# Patient Record
Sex: Female | Born: 1990
Health system: Southern US, Community
[De-identification: ages and names within clinical notes are randomized; demographics above are authoritative.]

## PROBLEM LIST (undated history)

## (undated) ENCOUNTER — Inpatient Hospital Stay (HOSPITAL_COMMUNITY): Payer: Self-pay

## (undated) DIAGNOSIS — Z789 Other specified health status: Secondary | ICD-10-CM

## (undated) DIAGNOSIS — O24419 Gestational diabetes mellitus in pregnancy, unspecified control: Secondary | ICD-10-CM

## (undated) HISTORY — DX: Gestational diabetes mellitus in pregnancy, unspecified control: O24.419

## (undated) HISTORY — PX: BACK SURGERY: SHX140

---
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
## 2009-03-22 ENCOUNTER — Encounter: Payer: Self-pay | Admitting: Family Medicine

## 2009-03-22 ENCOUNTER — Ambulatory Visit: Payer: Self-pay | Admitting: Family Medicine

## 2009-03-22 LAB — CONVERTED CEMR LAB
Beta hcg, urine, semiquantitative: NEGATIVE
Bilirubin Urine: NEGATIVE
Chlamydia, DNA Probe: NEGATIVE
Glucose, Urine, Semiquant: NEGATIVE
pH: 7

## 2009-03-23 ENCOUNTER — Encounter: Payer: Self-pay | Admitting: Family Medicine

## 2009-03-26 ENCOUNTER — Encounter (INDEPENDENT_AMBULATORY_CARE_PROVIDER_SITE_OTHER): Payer: Self-pay | Admitting: *Deleted

## 2009-06-23 ENCOUNTER — Emergency Department (HOSPITAL_COMMUNITY): Admission: EM | Admit: 2009-06-23 | Discharge: 2009-06-23 | Payer: Self-pay | Admitting: Emergency Medicine

## 2009-06-30 ENCOUNTER — Ambulatory Visit: Payer: Self-pay | Admitting: Family Medicine

## 2009-06-30 LAB — CONVERTED CEMR LAB
Blood in Urine, dipstick: NEGATIVE
Epithelial cells, urine: >20 /lpf
Nitrite: NEGATIVE
Specific Gravity, Urine: 1.015
Urobilinogen, UA: 1
WBC, UA: >20 cells/hpf
pH: 6.5

## 2009-07-25 ENCOUNTER — Emergency Department (HOSPITAL_COMMUNITY): Admission: EM | Admit: 2009-07-25 | Discharge: 2009-07-25 | Payer: Self-pay | Admitting: Emergency Medicine

## 2009-07-25 ENCOUNTER — Telehealth: Payer: Self-pay | Admitting: *Deleted

## 2009-07-26 ENCOUNTER — Other Ambulatory Visit: Admission: RE | Admit: 2009-07-26 | Discharge: 2009-07-26 | Payer: Self-pay | Admitting: Interventional Radiology

## 2009-07-26 ENCOUNTER — Encounter: Admission: RE | Admit: 2009-07-26 | Discharge: 2009-07-26 | Payer: Self-pay | Admitting: Otolaryngology

## 2009-07-28 ENCOUNTER — Telehealth: Payer: Self-pay | Admitting: Family Medicine

## 2009-08-02 ENCOUNTER — Ambulatory Visit (HOSPITAL_COMMUNITY): Admission: RE | Admit: 2009-08-02 | Discharge: 2009-08-02 | Payer: Self-pay | Admitting: Otolaryngology

## 2009-08-05 ENCOUNTER — Ambulatory Visit: Payer: Self-pay | Admitting: Family Medicine

## 2009-11-10 ENCOUNTER — Ambulatory Visit: Payer: Self-pay | Admitting: Family Medicine

## 2010-05-18 ENCOUNTER — Encounter: Payer: Self-pay | Admitting: Family Medicine

## 2010-05-18 ENCOUNTER — Ambulatory Visit: Payer: Self-pay | Admitting: Family Medicine

## 2010-05-18 LAB — CONVERTED CEMR LAB
Chlamydia, DNA Probe: NEGATIVE
GC Probe Amp, Genital: NEGATIVE
Whiff Test: POSITIVE

## 2010-05-30 ENCOUNTER — Encounter: Payer: Self-pay | Admitting: Family Medicine

## 2010-06-16 ENCOUNTER — Ambulatory Visit: Admission: RE | Admit: 2010-06-16 | Discharge: 2010-06-16 | Payer: Self-pay | Source: Home / Self Care

## 2010-06-16 DIAGNOSIS — N938 Other specified abnormal uterine and vaginal bleeding: Secondary | ICD-10-CM | POA: Insufficient documentation

## 2010-06-16 DIAGNOSIS — N925 Other specified irregular menstruation: Secondary | ICD-10-CM | POA: Insufficient documentation

## 2010-06-16 DIAGNOSIS — N949 Unspecified condition associated with female genital organs and menstrual cycle: Secondary | ICD-10-CM

## 2010-06-16 DIAGNOSIS — N921 Excessive and frequent menstruation with irregular cycle: Secondary | ICD-10-CM | POA: Insufficient documentation

## 2010-07-09 ENCOUNTER — Encounter: Payer: Self-pay | Admitting: Otolaryngology

## 2010-07-20 NOTE — Letter (Signed)
Summary: Generic Letter  [facility]  [address]   [place], [place] [address]   Phone: [phone]  Fax: [phone]    05/30/2010  Ariel Cardenas [address] [place], [place]  [address]  Dear Ms. Sitter,  I am writing to inform you that all your laboratory studies were normal.  Please start your birth control pills if you have not already to prevent pregnancy.  It is also important to use condoms every time you have intercourse to prevent you from getting an infection.  Please contact the office if you have any problems or questions.   Sincerely,   Ardyth Gal MD  Appended Document: Generic Letter mailed

## 2010-07-20 NOTE — Assessment & Plan Note (Signed)
Summary: candidiasis vaginal   Vital Signs:  Patient profile:   20 year old female Weight:      123.6 pounds BMI:     23.25 Temp:     98.3 degrees F oral Pulse rate:   91 / minute Pulse rhythm:   regular BP sitting:   117 / 80  (left arm) Cuff size:   regular  Vitals Entered By: Loralee Pacas CMA (June 30, 2009 1:56 PM)  Primary Care Provider:  Ancil Boozer  MD  CC:  concerned for yeast infxn.  History of Present Illness: 20yo F here with concern for yeast infxn  Concern for yeast infxn: Symptoms started 2 days ago.  Vaginal itching, irritation, and discharge.  No dysuria, hematuria, pelvic/abd pain, or fever.  States that she has had yeast infections in the past that presented the same way.  States she was on abx last week for a throat infection.    Allergies: No Known Drug Allergies  Review of Systems        Vaginal itching, irritation, and discharge.  No dysuria, hematuria, pelvic/abd pain, or fever.  Physical Exam  General:  VS reviewed.  Well appearing, NAD Genitalia:  External genitalia nl w/o lesions Nl vaginal mucosa with thick white discharge- wet prep obtained; pt declined GC/Chl screening   Impression & Recommendations:  Problem # 1:  CANDIDIASIS, VAGINAL (ICD-112.1) Assessment Deteriorated  Wet prep positive for significant yeast.  Pt responded well to fluconazole in the past.  Will treat with 150mg  x 1.  Will f/u if no improvements.   The following medications were removed from the medication list:    Fluconazole 150 Mg Tabs (Fluconazole) .Marland Kitchen... 1 tab by mouth x1 now. Her updated medication list for this problem includes:    Fluconazole 150 Mg Tabs (Fluconazole) .Marland Kitchen... 1 tab by mouth once  Orders: [facility]- Est Level  3 ([address])  Complete Medication List: 1)  Fluconazole 150 Mg Tabs (Fluconazole) .Marland Kitchen.. 1 tab by mouth once  Other Orders: [facility] (00000) [id number] Prescriptions: FLUCONAZOLE 150 MG TABS (FLUCONAZOLE) 1 tab by mouth  once  #1 x 0   Entered and Authorized by:   Marisue Ivan  MD   Signed by:   Marisue Ivan  MD on 06/30/2009   Method used:   [facility]        [facility] [address] [phone]* (retail)       [address]       [place], [place]  [address]       Ph: [phone] or [phone]       Fax: [phone]   RxID:   [id number]   Laboratory Results   Urine Tests  Date/Time Received: June 30, 2009 2:06 PM Date/Time Reported: June 30, 2009 3:05 PM   Routine Urinalysis   Color: yellow Appearance: Clear Glucose: negative   (Normal Range: Negative) Bilirubin: smal;  reflex to ictotest = negative   (Normal Range: Negative) Ketone: small (15)   (Normal Range: Negative) Spec. Gravity: 1.015   (Normal Range: 1.003-1.035) Blood: negative   (Normal Range: Negative) pH: 6.5   (Normal Range: 5.0-8.0) Protein: 30   (Normal Range: Negative) Urobilinogen: 1.0   (Normal Range: 0-1) Nitrite: negative   (Normal Range: Negative) Leukocyte Esterace: moderate   (Normal Range: Negative)  Urine Microscopic WBC/HPF: >20 RBC/HPF: occ Bacteria/HPF: 3+ Mucous/HPF: 3+ Epithelial/HPF: >20 Yeast/HPF: moderate hyphae and spores    Comments: ...............test performed by......Marland KitchenKendal Hymen  A. [place], MLS (ASCP)cm  Date/Time Received: June 30, 2009 2:06 PM  Date/Time Reported: June 30, 2009 3:16 PM   [employer] Source: vag WBC/hpf: >20 Bacteria/hpf: 3+  Rods Clue cells/hpf: none  Negative whiff Yeast/hpf: many Trichomonas/hpf: none Comments: ...............test performed by......Marland KitchenBonnie A. [place], MLS (ASCP)cm

## 2010-07-20 NOTE — Assessment & Plan Note (Signed)
Summary: bleeding/severe cramps/eo   Vital Signs:  Patient profile:   20 year old female Height:      61.25 inches Weight:      121 pounds BMI:     22.76 Temp:     98.0 degrees F oral Pulse rate:   81 / minute BP sitting:   112 / 84  (left arm) Cuff size:   regular  Vitals Entered By: Garen Grams LPN (June 16, 2010 2:10 PM) CC: break through bleeding between periods Is Patient Diabetic? No Pain Assessment Patient in pain? no        Primary Provider:  . [facility]  CC:  break through bleeding between periods.  History of Present Illness: Pt here to discuss breakthorugh bleeding. She was recently checked for STDs.   ? breakthrough bleeding- LMP 12/18-12/22. Now bleeding x 2 days with 4-5 days of cramping. This is the first time this has occured. She has not yet picked up the birth control prescribed to her at her last visit. She denies dysuria, increased urinary frequecy and hesitancy. She is sexually active with one partner. Denies pain with intercourse. She does not use condoms.     Preventive Screening-Counseling & Management  Alcohol-Tobacco     Smoking Status: never     Packs/Day: occ  Allergies: No Known Drug Allergies  Physical Exam  General:  Well-developed,well-nourished,in no acute distress; alert,appropriate and cooperative throughout examination Abdomen:  Bowel sounds positive,abdomen soft and without masses, organomegaly or hernias noted. MIld TTP.    Impression & Recommendations:  Problem # 1:  MENSTRUAL SPOTTING (ICD-626.6) HCG negtive. Reassured pt that occasional menstrual spotting is normal. Encouraged pt to pick up OCPs and comdoms during intercourse to protect against STDs.   Orders: [facility]- Est Level  3 ([address]78)  Complete Medication List: 1)  Necon 10/11 (28) 35 Mcg Tabs (Norethin-eth estrad biphasic) .... One by mouth daily  Other Orders: U Preg-[facility] (29562)  Patient Instructions: 1)  It was nice to meet you today. 2)  Your  pregnancy test is negative. 3)  I would like to reassure that your bleeding is normal, most women will have one abnormal period/episode of bleeding in one year. 4)  Go ahead and pick-up your birth control pills and start them.  5)  Use condoms for birth control and STD prevention. 6)  -Dr. Armen Pickup   Orders Added: 1)  U Preg-[facility] [81025] 2)  [facility]- Est Level  3 [13086]    Laboratory Results   Urine Tests  Date/Time Received: June 16, 2010 2:41 PM  Date/Time Reported: June 16, 2010 2:52 PM     Urine HCG: negative Comments: ...............test performed by......Marland KitchenBonnie A. [place], MLS (ASCP)cm

## 2010-07-20 NOTE — Progress Notes (Signed)
Summary: triag  Phone Note Call from Patient Call back at [phone]   Caller: Patient Summary of Call: Pt has been on an antibotic and thinks she has yeast infection wondering if she can get something called in for it.  [facility]. Initial call taken by: Clydell Hakim,  July 28, 2009 12:22 PM  Follow-up for Phone Call        to Dr. Constance Goltz Follow-up by: Golden Circle RN,  July 28, 2009 12:29 PM  Additional Follow-up for Phone Call Additional follow up Details #1::        Fluconazole x1 day called in.  Please inform patient to make appointment if:  fever, abdominal pain, vaginal discharge, dysuria, yeast infection symptoms not better in 2 days. Additional Follow-up by: Romero Belling MD,  July 28, 2009 12:41 PM    Additional Follow-up for Phone Call Additional follow up Details #2::    left message. want to tell her rx is sent & give her flags of when to come in Follow-up by: Golden Circle RN,  July 28, 2009 1:43 PM  Additional Follow-up for Phone Call Additional follow up Details #3:: Details for Additional Follow-up Action Taken: pt returned call Additional Follow-up by: Clydell Hakim,  July 28, 2009 1:48 PM  New/Updated Medications: FLUCONAZOLE 150 MG TABS (FLUCONAZOLE) 1 tab by mouth once Prescriptions: FLUCONAZOLE 150 MG TABS (FLUCONAZOLE) 1 tab by mouth once  #1 x 0   Entered and Authorized by:   Romero Belling MD   Signed by:   Romero Belling MD on 07/28/2009   Method used:   [facility]        [facility] [address] [phone]* (retail)       [address]       [place], [place]  [address]       Ph: [phone] or [phone]       Fax: [phone]   RxID:   [id number]  advised of flags. she agreed to call Monday am if no improvement or other symptoms listed occur.Golden Circle RN  July 28, 2009 2:20 PM

## 2010-07-20 NOTE — Assessment & Plan Note (Signed)
Summary: cpp/eo   Vital Signs:  Patient profile:   20 year old female Height:      61.25 inches Weight:      122.25 pounds BMI:     22.99 BSA:     1.54 Temp:     98.3 degrees F Pulse rate:   79 / minute BP sitting:   134 / 83  Vitals Entered By: Jone Baseman CMA (May 18, 2010 1:48 PM) CC: CPP Is Patient Diabetic? No Pain Assessment Patient in pain? no        Primary Provider:  . [facility]  CC:  CPP.  History of Present Illness: Pt. presents for physical.  She would like STD testing done.  Says that she is currently in a monogomous relationship, but within the last year she had sex with her ex-boyfriend who she thinks was cheating on her.  She denies any symptoms.  She is not using condoms with intercourse and is not using birth control.  Says she does not want to become pregnant but did not like being on birth control pills.  She complains that she thinks she is too skinny and wants to gain weight but cannot.  Does not exercise.  Denies tobacco or alcohol use.   ROS: Denies depressed thoughts, SI/HI.  Denies chest pain, shortness of breath, palpitations, abdominal pain n/v/d.      Habits & Providers  Alcohol-Tobacco-Diet     Tobacco Status: never     Cigarette Packs/Day: occ  Current Medications (verified): 1)  Necon 10/11 (28) 35 Mcg Tabs (Norethin-Eth Estrad Biphasic) .... One By Mouth Daily  Allergies: No Known Drug Allergies  Past History:  Family History: Last updated: 05/18/2010 Father with severe gastritis Younger brother with allergies.   Social History: Last updated: 05/18/2010 Lives with mom and works at a [employer].  Denies alcohol, drugs or tobacco use.  She is sexually active and has been since age 18 and often has unprotected sex.   Risk Factors: Smoking Status: never (05/18/2010) Packs/Day: occ (05/18/2010)  Family History: Father with severe gastritis Younger brother with allergies.   Social History: Lives with mom  and works at a [employer].  Denies alcohol, drugs or tobacco use.  She is sexually active and has been since age 1 and often has unprotected sex.   Review of Systems       See HPI.   Physical Exam  General:  Well-developed,well-nourished,in no acute distress; alert,appropriate and cooperative throughout examination Eyes:  No corneal or conjunctival inflammation noted. EOMI. Perrla. Funduscopic exam benign, without hemorrhages, exudates or papilledema. Vision grossly normal. Mouth:  Oral mucosa and oropharynx without lesions or exudates.  Teeth in good repair. Abdomen:  Bowel sounds positive,abdomen soft and non-tender without masses, organomegaly or hernias noted. Genitalia:  Normal introitus for age, no external lesions, white vaginal discharge, mucosa pink and moist, no vaginal or cervical lesions, no vaginal atrophy, no friaility or hemorrhage, normal uterus size and position, no adnexal masses or tenderness Pulses:  R and L radial,dorsalis pedis and posterior tibial pulses are full and equal bilaterally Extremities:  No clubbing, cyanosis, edema, or deformity noted with normal full range of motion of all joints.   Psych:  Cognition and judgment appear intact. Alert and cooperative with normal attention span and concentration. No apparent delusions, illusions, hallucinations   Impression & Recommendations:  Problem # 1:  Preventive Health Care (ICD-V70.0) Pt. overall doing well.  Reinforced healthy eating and exercise.    Problem #  2:  SEXUAL ACTIVITY, HIGH RISK (ICD-V69.2)  Advised pt. to use condoms for STD prevention as well as starting birth control pills.  She does not want an implanon at this time but says she will think about it as a birth control option.   Pt. also with some discharge and concern for STD's.  Will send wet prep and g/c chlamydia.  Orders: [facility] - Est  18-39 yrs ([address])  Complete Medication List: 1)  Necon 10/11 (28) 35 Mcg Tabs (Norethin-eth estrad biphasic)  .... One by mouth daily  Other Orders: GC/Chlamydia-[facility] (87591/87491) [facility] [phone])  Patient Instructions: 1)  It was nice to meet you today. 2)  I want to reassure you that you are the right weight for your height and not to worry that you are to skinny. 3)  Please start birth control pills the "Sunday after your next period starts.  You should take them every day at the same time. 4)  I also want to encourage you to use condoms with intercouse to protect yourself from STD's. 5)  Please contact the office with any questions.  Prescriptions: NECON 10/11 (28) 35 MCG TABS (NORETHIN-ETH ESTRAD BIPHASIC) one by mouth daily  #1 pack x 12   Entered and Authorized by:   Rachel Chamberlain MD   Signed by:   Rachel Chamberlain MD on 05/18/2010   Method used:   Electronically to        [facility]* (retail)       [address]       [address] Shipping/mailing       [place], [place]  [address]       Ph: [phone]       Fax: [phone]   RxID:   [id number]    Orders Added: 1)  GC/Chlamydia-[facility] [87591/87491] 2)  Mellody Drown Prep- [facility] [87210] 3)  [facility] - Est  18-39 yrs [[address]395]    Laboratory Results  Date/Time Received: May 18, 2010 2:17 PM  Date/Time Reported: May 18, 2010 2:33 PM   [employer] Source: vaginal WBC/hpf: 1-5 Bacteria/hpf: 3+  Cocci Clue cells/hpf: moderate  Positive whiff Yeast/hpf: none Trichomonas/hpf: none Comments: Rod bacteria also present ...........test performed by...........Marland KitchenTerese Door, CMA

## 2010-07-20 NOTE — Progress Notes (Signed)
Summary: triage  Phone Note Call from Patient Call back at [phone]   Caller: Patient Summary of Call: discovered a lump on her neck - painful Initial call taken by: De Nurse,  July 25, 2009 9:44 AM  Follow-up for Phone Call        mom called back because she also has fever. Follow-up by: Clydell Hakim,  July 25, 2009 11:09 AM  Additional Follow-up for Phone Call Additional follow up Details #1::        lump present x 1 wk. whole neck was swollen the week before. . she went to [facility] & got antibiotics. swelling went down, now it is starting again, lump is L midline to side. sent to UC as we have no m0re appts left here. she is having some pain with swallowing. she agrees with going to UC now Additional Follow-up by: Golden Circle RN,  July 25, 2009 12:24 PM

## 2010-07-20 NOTE — Assessment & Plan Note (Signed)
Summary: tb test/eo  Nurse Visit   Allergies: No Known Drug Allergies  Immunizations Administered:  PPD Skin Test:    Vaccine Type: PPD    Site: right forearm    Mfr: Sanofi Pasteur    Dose: 0.1 ml    Route: ID    Given by: Theresia Lo RN    Exp. Date: 11/13/2011    Lot #: [id number]  Orders Added: 1)  TB Skin Test [86580] 2)  Admin 1st Vaccine [phone]

## 2010-07-20 NOTE — Assessment & Plan Note (Signed)
Summary: ear clogged up,tcb   Vital Signs:  Patient profile:   20 year old female Height:      61.25 inches Weight:      120.3 pounds BMI:     22.63 Temp:     97.7 degrees F oral Pulse rate:   75 / minute BP sitting:   123 / 79  (left arm) Cuff size:   regular  Vitals Entered By: Garen Grams LPN (Nov 10, 2009 2:56 PM) CC: heasd congestion, left ear stopped up Is Patient Diabetic? No Pain Assessment Patient in pain? no        Primary Care Provider:  Ancil Boozer  MD  CC:  heasd congestion and left ear stopped up.  History of Present Illness: 1. congestion Has had a stuffy nose x 3 weeks. Noticed that R ear feels clogged for the past several days. Denies any runny nose. Gets yellow mucus out when she blows her nose. Hasn't tried any medicine at this point.   fevers: no    chills: no    nausea: no    vomiting: no    diarrhea: no     chest pain:no     shortness of breath:   no   denies HA.   Habits & Providers  Alcohol-Tobacco-Diet     Tobacco Status: never  Allergies: No Known Drug Allergies  Social History: Smoking Status:  never  Review of Systems       review of systems as noted in HPI section   Physical Exam  General:  vital signs reviewed and normal Alert, appropriate; well-dressed and well-nourished  Ears:  canals clear; normal tympanic membranes bilaterally; no drainage or discharge    hearing grossly normal.  Nose:  boggy nasal mucus. mild clear d/c.  Mouth:  Oral mucosa and oropharynx without lesions or exudates.  Teeth in good repair. Neck:   no lymphadenopathy  Lungs:  work of breathing unlabored, clear to auscultation bilaterally; no wheezes, rales, or ronchi; good air movement throughout  Heart:  regular rate and rhythm, no murmurs; normal s1/s2    Impression & Recommendations:  Problem # 1:  OTHER ACUTE SINUSITIS (ICD-461.8) Assessment New  sinusitis with symptoms x 3 weeks. Given duration will treat with azithro. Pseudoephedrine and  supportive measures. Return parameters and warning signs of worsening illness discussed.  Patient agreeable. See instructions.  Her updated medication list for this problem includes:    Azithromycin 500 Mg Tabs (Azithromycin) ..... One tab by mouth daily x 5 days  Orders: [facility]- Est Level  3 ([address])  Complete Medication List: 1)  Azithromycin 500 Mg Tabs (Azithromycin) .... One tab by mouth daily x 5 days  Patient Instructions: 1)  take the antibiotic once  a day for 5 days 2)  make sure you're drinking lots of fluids 3)  use saline nasal rinses to help with the congestion 4)  also take pseudoephedrine 60 mg every 4-6 hours for congestion and headache 5)  follow-up if your not feeling better in the next 7-10 days or if you get worse suddently. Prescriptions: AZITHROMYCIN 500 MG TABS (AZITHROMYCIN) one tab by mouth daily x 5 days  #5 x 0   Entered and Authorized by:   Myrtie Soman  MD   Signed by:   Myrtie Soman  MD on 11/10/2009   Method used:   Electronically to        [facility] [address] [phone]* (retail)       [address]  [place], [place]  [address]       Ph: [phone] or [phone]       Fax: [phone]   RxID:   [id number]

## 2010-08-15 ENCOUNTER — Other Ambulatory Visit: Payer: Self-pay | Admitting: Family Medicine

## 2010-08-15 ENCOUNTER — Ambulatory Visit (INDEPENDENT_AMBULATORY_CARE_PROVIDER_SITE_OTHER): Payer: Commercial Managed Care - PPO | Admitting: Family Medicine

## 2010-08-15 ENCOUNTER — Encounter: Payer: Self-pay | Admitting: Family Medicine

## 2010-08-15 VITALS — BP 115/76 | HR 82 | Temp 98.1°F | Ht 62.0 in | Wt 124.0 lb

## 2010-08-15 DIAGNOSIS — N912 Amenorrhea, unspecified: Secondary | ICD-10-CM

## 2010-08-15 DIAGNOSIS — Z20828 Contact with and (suspected) exposure to other viral communicable diseases: Secondary | ICD-10-CM | POA: Insufficient documentation

## 2010-08-15 DIAGNOSIS — Z309 Encounter for contraceptive management, unspecified: Secondary | ICD-10-CM | POA: Insufficient documentation

## 2010-08-15 DIAGNOSIS — N760 Acute vaginitis: Secondary | ICD-10-CM

## 2010-08-15 LAB — POCT WET PREP (WET MOUNT): Trichomonas Wet Prep HPF POC: NEGATIVE

## 2010-08-15 LAB — CONVERTED CEMR LAB: GC Probe Amp, Genital: NEGATIVE

## 2010-08-15 MED ORDER — NORGESTIMATE-ETH ESTRADIOL 0.25-35 MG-MCG PO TABS: 1.0000 | ORAL_TABLET | Freq: Every day | ORAL | Status: DC

## 2010-08-15 NOTE — Assessment & Plan Note (Signed)
Highly recommended OCP compliance if she does not want to get pregnant, changed pill to monophasic as the biphasic one may be causing more break through bleeding and leading to non-compliance.  Not sure if she will begin this or use this consistently based on her attitude.

## 2010-08-15 NOTE — Assessment & Plan Note (Signed)
Counseled on high risk behavior, STD check, recommended condom use

## 2010-08-15 NOTE — Progress Notes (Signed)
  Subjective:    Patient ID: Ariel Cardenas, female    DOB: April 07, 1991, 19 y.o.   MRN: [id number]  HPI :  Wants a STD check, last checked GC and CT in December. Cannot explain why she is not using condoms.  She has stopped her OCP she cannot keep up with them and they "mess up her periods".  When asked she does not want to get pregnant.  Last HIV checked and was done over one year ago.  Denies any complaints    Review of Systems  Constitutional: Negative for fever.  Genitourinary: Positive for menstrual problem. Negative for dysuria, vaginal discharge, difficulty urinating, genital sores, vaginal pain and pelvic pain.       Objective:   Physical Exam  Constitutional: She appears well-developed and well-nourished.  Genitourinary: Vagina normal and uterus normal. No vaginal discharge found.       No CMT, no discharge from cervix          Assessment & Plan:

## 2010-08-15 NOTE — Patient Instructions (Signed)
Please add cell phone to chart, I will call for abnormal , I will send a letter for normal Take the pill the same time every day, at bedtime, if you stay on the pill and take it regularly your periods will be short and predictable and you will not get pregnant, if you miss a pill take 2 the next day, if you miss more than one pill or another time during the month use back up protection

## 2010-08-16 ENCOUNTER — Encounter: Payer: Self-pay | Admitting: Family Medicine

## 2010-09-03 LAB — POCT RAPID STREP A (OFFICE): Streptococcus, Group A Screen (Direct): NEGATIVE

## 2010-09-06 LAB — CBC
HCT: 32.1 % — ABNORMAL LOW (ref 36.0–46.0)
MCHC: 32.8 g/dL (ref 30.0–36.0)
Platelets: 314 10*3/uL (ref 150–400)
RBC: 3.8 MIL/uL — ABNORMAL LOW (ref 3.87–5.11)
WBC: 5.1 10*3/uL (ref 4.0–10.5)
WBC: 8.7 10*3/uL (ref 4.0–10.5)

## 2010-09-06 LAB — PROTIME-INR
INR: 1.02 (ref 0.00–1.49)
Prothrombin Time: 13.3 seconds (ref 11.6–15.2)

## 2010-09-06 LAB — BASIC METABOLIC PANEL
GFR calc Af Amer: >60 mL/min (ref >60)
GFR calc non Af Amer: >60 mL/min (ref >60)
Potassium: 3.5 mEq/L (ref 3.5–5.1)
Sodium: 130 mEq/L — ABNORMAL LOW (ref 135–145)

## 2010-09-06 LAB — POCT I-STAT, CHEM 8
BUN: 7 mg/dL (ref 6–23)
Chloride: 101 mEq/L (ref 96–112)
Creatinine, Ser: 0.9 mg/dL (ref 0.4–1.2)
Potassium: 3.9 mEq/L (ref 3.5–5.1)
Sodium: 136 mEq/L (ref 135–145)

## 2010-09-06 LAB — DIFFERENTIAL
Lymphocytes Relative: 24 % (ref 12–46)
Lymphs Abs: 2.1 10*3/uL (ref 0.7–4.0)
Neutrophils Relative %: 68 % (ref 43–77)

## 2010-09-06 LAB — THYROGLOBULIN LEVEL
Antithyroglobulin Ab: 37.6 U/mL (ref 0.0–60.0)
Thyroglobulin: 64.9 ng/mL — ABNORMAL HIGH (ref 0.0–55.0)

## 2010-12-04 ENCOUNTER — Encounter: Payer: Self-pay | Admitting: Family Medicine

## 2010-12-04 ENCOUNTER — Ambulatory Visit (INDEPENDENT_AMBULATORY_CARE_PROVIDER_SITE_OTHER): Payer: Commercial Managed Care - PPO | Admitting: Family Medicine

## 2010-12-04 DIAGNOSIS — J309 Allergic rhinitis, unspecified: Secondary | ICD-10-CM | POA: Insufficient documentation

## 2010-12-04 DIAGNOSIS — Z309 Encounter for contraceptive management, unspecified: Secondary | ICD-10-CM

## 2010-12-04 MED ORDER — FLUTICASONE PROPIONATE 50 MCG/ACT NA SUSP: 1.0000 | Freq: Every day | NASAL | Status: DC

## 2010-12-04 MED ORDER — CETIRIZINE HCL 10 MG PO CAPS: 10.0000 mg | ORAL_CAPSULE | Freq: Every day | ORAL | Status: DC

## 2010-12-04 NOTE — Assessment & Plan Note (Signed)
Patient not taking OCP's that were rx'd, not currently sexually active.

## 2010-12-04 NOTE — Patient Instructions (Signed)
It was nice to meet you.  I am sorry you have not been feeling well.  I think your cough is from seasonal allergies.  I am prescribing two kinds of allergy medications, one pill to take at bedtime every night, and one nasal spray.  If you do not feel better in one to two weeks, please call the office to be seen again.

## 2010-12-04 NOTE — Assessment & Plan Note (Signed)
Rx Cetirizine and Flonase to alleviate symptoms.  Follow up as needed for persistent, worsening cough, or appearance of new symptoms.

## 2010-12-04 NOTE — Progress Notes (Signed)
Subjective:     Ariel Cardenas is a 20 y.o. female who presents for evaluation of productive cough. Symptoms began 3 weeks ago. Symptoms have been unchanged since that time. Past history is significant for nothing.  She says the cough keeps her up at night sometimes.  She says she coughs up yellow mucus.  This happened last year, during the winter and lasted several weeks.  She says that one night about a month ago she felt hot, but denies any fevers, chills, night sweats.  She denies any shortness of breath, chest pain, sore throat.      Review of Systems Pertinent items are noted in HPI.    Objective:    BP 125/83  Pulse 91  Temp(Src) 98.2 F (36.8 C) (Oral)  Wt 124 lb (56.246 kg) General appearance: alert, cooperative and no distress Ears: normal TM's and external ear canals both ears Nose: no discharge, mild congestion, no sinus tenderness Throat: lips, mucosa, and tongue normal; teeth and gums normal Neck: mild anterior cervical adenopathy, supple, symmetrical, trachea midline and thyroid not enlarged, symmetric, no tenderness/mass/nodules Lungs: clear to auscultation bilaterally and no crackles or wheezes noted, normal work of breathing.  Heart: regular rate and rhythm, S1, S2 normal, no murmur, click, rub or gallop Extremities: extremities normal, atraumatic, no cyanosis or edema Pulses: 2+ and symmetric    Assessment:    Allergic Rhinitis    Plan:   Rx Cetirizine and Flonase to alleviate symptoms.  Follow up as needed for persistent, worsening cough, or appearance of new symptoms.

## 2011-02-07 ENCOUNTER — Encounter: Payer: Self-pay | Admitting: Family Medicine

## 2011-02-07 ENCOUNTER — Ambulatory Visit (INDEPENDENT_AMBULATORY_CARE_PROVIDER_SITE_OTHER): Payer: Commercial Managed Care - PPO | Admitting: Family Medicine

## 2011-02-07 DIAGNOSIS — J069 Acute upper respiratory infection, unspecified: Secondary | ICD-10-CM

## 2011-02-07 DIAGNOSIS — Z23 Encounter for immunization: Secondary | ICD-10-CM

## 2011-02-07 MED ORDER — TETANUS-DIPHTH-ACELL PERTUSSIS 5-2-15.5 LF-MCG/0.5 IM SUSP
0.5000 mL | Freq: Once | INTRAMUSCULAR | Status: AC
  Administered 2011-02-07: 0.5 mL via INTRAMUSCULAR

## 2011-02-07 NOTE — Progress Notes (Signed)
S: Pt comes in today for cough.  Per pt, she has had issues with cough for years but it has acutely worsened over the past 4 days.  Starting on Saturday, she began having congestion, rhinorrhea, and cough.  No nausea but has had 2-3 episodes of post-tussive emesis.  No fevers or chills.  No SOB.  Emesis is mucus-like, non-bloody.  No sore throat or difficulty swallowing. No known sick contacts. Works in a [employer].    ROS: Per HPI  History  Smoking status  . Never Smoker   Smokeless tobacco  . Not on file    O:  Filed Vitals:   02/07/11 0947  BP: 133/82  Pulse: 87  Temp: 98.9 F (37.2 C)    Gen: NAD HEENT: MMM, mild pharyngeal erythema c/w post-nasal drip; no exudate; TMs nml; nares and nasal mucosa nml; no cervical LAD CV: RRR, no murmur Pulm: CTA bilat, no wheezes or crackles    A/P: 20 y.o. female p/w worsening cough -See problem list -f/u PRN

## 2011-02-07 NOTE — Assessment & Plan Note (Signed)
No red flags. Post-tussive emesis has only been occuring x1-2 days so less concerning for pertussis than if it had  Been ongoing for 2 weeks.  Encouraged fluids and rest. Good hand washing.

## 2011-02-07 NOTE — Patient Instructions (Signed)
I think have you have a viral URI, or cold. You can use any of the over the counter cold products such as DayQuil and NyQuil.  You can also try Afrin, which is a nasal spray to help with the runny nose.  Only use that for 3 days in a row at max, but it usually helps a lot with drying out your sinuses. The cough could stick around for another few weeks. When you are coughing up mucus try to spit it out rather than swallowing it to help with the vomiting.  Make sure you drink plenty of fluids and get lots of rest!   Common Cold, Adult An upper respiratory tract infection, or cold, is a viral infection of the air passages to the lung. Colds are contagious, especially during the first 3 or 4 days. Antibiotics cannot cure a cold. Cold germs are spread by coughs, sneezes, and hand to hand contact. A respiratory tract infection usually clears up in a few days, but some people may be sick for a week or two. HOME CARE INSTRUCTIONS  Only take over-the-counter or prescription medicines for pain, discomfort, or fever as directed by your caregiver.   Be careful not to blow your nose too hard. This may cause a nosebleed.   Use a cool-mist humidifier (vaporizer) to increase air moisture. This will make it easier for you to breath. Do not use hot steam.   Rest as much as possible and get plenty of sleep.   Wash your hands often, especially after you blow your nose. Cover your mouth and nose with a tissue when you sneeze or cough.   Drink at least 8 glasses of clear liquids every day, such as water, fruit juices, tea, clear soups, and carbonated beverages.  SEEK MEDICAL CARE IF:  An oral temperature above 101 lasts 4 days or more, and is not controlled by medication.   You have a sore throat that gets worse or you see white or yellow spots in your throat.   Your cough gets worse or lasts more than 10 days.   You have a rash somewhere on your skin. You have large and tender lumps in your neck.   You  have an earache or a headache.   You have thick, greenish or yellowish discharge from your nose.   You cough-up thick yellow, green, gray or bloody mucus (secretions).  SEEK IMMEDIATE MEDICAL CARE IF: You have trouble breathing, chest pain, or your skin or nails look gray or blue. MAKE SURE YOU:   Understand these instructions.   Will watch your condition.   Will get help right away if you are not doing well or get worse.  Document Released: 06/01/2000 Document Re-Released: 05/17/2008 [facility] Patient Information 2011 [place], [place].

## 2011-03-20 LAB — POCT URINALYSIS DIP (DEVICE)
Operator id: 235561
Protein, ur: 30 mg/dL — AB
Specific Gravity, Urine: 1.02 (ref 1.005–1.030)
Urobilinogen, UA: 1 mg/dL (ref 0.0–1.0)

## 2011-03-20 LAB — POCT PREGNANCY, URINE: Preg Test, Ur: NEGATIVE

## 2011-10-22 ENCOUNTER — Encounter: Payer: Self-pay | Admitting: Family Medicine

## 2011-10-22 ENCOUNTER — Ambulatory Visit (INDEPENDENT_AMBULATORY_CARE_PROVIDER_SITE_OTHER): Payer: Commercial Managed Care - PPO | Admitting: Family Medicine

## 2011-10-22 VITALS — BP 121/83 | HR 94 | Temp 98.6°F | Ht 62.0 in | Wt 126.6 lb

## 2011-10-22 DIAGNOSIS — R5381 Other malaise: Secondary | ICD-10-CM

## 2011-10-22 DIAGNOSIS — J069 Acute upper respiratory infection, unspecified: Secondary | ICD-10-CM

## 2011-10-22 DIAGNOSIS — R5383 Other fatigue: Secondary | ICD-10-CM | POA: Insufficient documentation

## 2011-10-22 LAB — CBC
MCHC: 31.3 g/dL (ref 30.0–36.0)
MCV: 78.9 fL (ref 78.0–100.0)
Platelets: 235 10*3/uL (ref 150–400)
RDW: 13.8 % (ref 11.5–15.5)
WBC: 5.8 10*3/uL (ref 4.0–10.5)

## 2011-10-22 NOTE — Assessment & Plan Note (Signed)
May be due to anemia with current URI.   Given her history will check cbc

## 2011-10-22 NOTE — Patient Instructions (Signed)
You have a viral infection - take tylenol or ibuprofen for fever and aches  Use a mask at work  If you are not well in 7 days have high fever or shortness of breath then come back  I will call you if your tests are not good.  Otherwise I will send you a letter.  If you do not hear from me with in 2 weeks please call our office.

## 2011-10-22 NOTE — Assessment & Plan Note (Signed)
New  No signs of bacterial focal infection.  Treat symptomatically

## 2011-10-22 NOTE — Progress Notes (Signed)
  Subjective:    Patient ID: Linnette Parisi, female    DOB: 1990-11-06, 21 y.o.   MRN: [id number]  HPI  URI Fatigue Sick for 48 hrs with runny nose cough achy all over. Feels very tired and sometimes dizzy when stands.    No sick contacts.  No fever or shortness of breath or edema or rash or syncope  Review of Symptoms - see HPI  PMH - Smoking status noted.  History of anemia in 2011.  Never took iron or had labs checked since   Review of Systems     Objective:   Physical Exam Heart - Regular rate and rhythm.  No murmurs, gallops or rubs.    Lungs:  Normal respiratory effort, chest expands symmetrically. Lungs are clear to auscultation, no crackles or wheezes. Extremities:  No cyanosis, edema, or deformity noted with good range of motion of all major joints.   Skin:  Intact without suspicious lesions or rashes Nose - clear nasal discharge Ears:  External ear exam shows no significant lesions or deformities.  Otoscopic examination reveals clear canals, tympanic membranes are intact bilaterally without bulging, retraction, inflammation or discharge. Hearing is grossly normal bilaterall        Assessment & Plan:

## 2011-10-23 ENCOUNTER — Encounter: Payer: Self-pay | Admitting: Family Medicine

## 2012-03-13 ENCOUNTER — Ambulatory Visit (INDEPENDENT_AMBULATORY_CARE_PROVIDER_SITE_OTHER): Payer: 59 | Admitting: Family Medicine

## 2012-03-13 VITALS — BP 119/71 | HR 80 | Temp 98.9°F | Ht 62.0 in | Wt 122.8 lb

## 2012-03-13 DIAGNOSIS — I951 Orthostatic hypotension: Secondary | ICD-10-CM

## 2012-03-13 NOTE — Progress Notes (Signed)
Patient ID: Ariel Cardenas, female   DOB: 02-02-1991, 21 y.o.   MRN: [id number] Subjective: The patient is a 21 y.o. year old female who presents today for dizziness.  Was at work earlier today and noticed that she was lightheaded.  This was made worse by standing.   No prior history of this.  No sensation of room spinning. Some cough and sneezing but no fevers/chills.  No diarrhea.  No dysuria.  Patient's past medical, social, and family history were reviewed and updated as appropriate. History  Substance Use Topics  . Smoking status: Never Smoker   . Smokeless tobacco: Not on file  . Alcohol Use: Not on file   Objective:  Filed Vitals:   03/13/12 1342  BP: 119/71  Pulse: 80  Temp: 98.9 F (37.2 C)  Standing BP 112/74 with pulse in the 80s Gen: NAD CV: RRR, no murmurs Resp: CTABL Ext: <2 sec cap refill, good pulses  Assessment/Plan: Orthostatic hypotension possibly related to dehydration vs early viral URI.  Will treat with oral rehydration, rest, and RTC if not better in 1-2 days.  Please also see individual problems in problem list for problem-specific plans.

## 2012-03-13 NOTE — Patient Instructions (Signed)
I think you are a little dehydrated.  Drink plenty of water or gatoraid and avoid alcohol and drinks containing caffeine for the next several days. You can also try wearing some support stockings.  These can help lightheadedness some as well. If you are not better in a day or two, come back to see [place].

## 2012-04-21 ENCOUNTER — Ambulatory Visit (INDEPENDENT_AMBULATORY_CARE_PROVIDER_SITE_OTHER): Payer: 59 | Admitting: Family Medicine

## 2012-04-21 ENCOUNTER — Encounter: Payer: Self-pay | Admitting: Family Medicine

## 2012-04-21 VITALS — BP 116/72 | HR 91 | Temp 100.2°F | Ht 62.0 in | Wt 123.0 lb

## 2012-04-21 DIAGNOSIS — J029 Acute pharyngitis, unspecified: Secondary | ICD-10-CM

## 2012-04-21 DIAGNOSIS — J069 Acute upper respiratory infection, unspecified: Secondary | ICD-10-CM

## 2012-04-21 NOTE — Patient Instructions (Addendum)
Ms. Cowart,  Thank you for coming in to see me today,.  Your rapid strep test is negative. Your symptoms are consistent with a viral URI. For this please do the following:  1. Rest  2. Fluids  3. Tylenol 1000 mg every 8 hrs as needed for pain or fever can alternate with motrin 600 mg every 6 hrs.  4. Restart flonase  If you pain worsens, fever persist inspite of the above please return for f/u evaluation.  Dr. Armen Pickup

## 2012-04-21 NOTE — Assessment & Plan Note (Signed)
Your rapid strep test is negative. Your symptoms are consistent with a viral URI. For this please do the following:  1. Rest  2. Fluids  3. Tylenol 1000 mg every 8 hrs as needed for pain or fever can alternate with motrin 600 mg every 6 hrs.  4. Restart flonase  If you pain worsens, fever persist inspite of the above please return for f/u evaluation.

## 2012-04-21 NOTE — Progress Notes (Signed)
Patient ID: Maicee Yi, female   DOB: 18-Sep-1990, 21 y.o.   MRN: [id number] Subjective:     Dayjah Sovine is a 21 y.o. female who presents for evaluation of symptoms of a URI. Symptoms include cough described as nonproductive, fever no documented temp but feeling warm, nasal congestion and sore throat. Onset of symptoms was 1 day ago, and has been unchanged since that time. Treatment to date: none. Not taking flonase.   Review of Systems Pertinent items are noted in HPI.   Objective:    BP 116/72  Pulse 91  Temp 100.2 F (37.9 C) (Oral)  Ht 5\' 2"  (1.575 m)  Wt 123 lb (55.792 kg)  BMI 22.50 kg/m2 General appearance: alert, cooperative and no distress Head: Normocephalic, without obvious abnormality, atraumatic Eyes: conjunctivae/corneas clear. PERRL, EOM's intact. Fundi benign. Ears: normal TM's and external ear canals both ears Nose: turbinates pink, swollen Throat: lips, mucosa, and tongue normal; teeth and gums normal Neck: mild anterior cervical adenopathy, no carotid bruit, no JVD, supple, symmetrical, trachea midline and thyroid not enlarged, symmetric, no tenderness/mass/nodules Lungs: clear to auscultation bilaterally Heart: regular rate and rhythm, S1, S2 normal, no murmur, click, rub or gallop Extremities: extremities normal, atraumatic, no cyanosis or edema   Rapid strep test negative   Assessment:    viral upper respiratory illness   Plan:    Suggested symptomatic OTC remedies. Nasal steroids per orders. Follow up as needed.

## 2013-04-21 ENCOUNTER — Other Ambulatory Visit (HOSPITAL_COMMUNITY)
Admission: RE | Admit: 2013-04-21 | Discharge: 2013-04-21 | Disposition: A | Discharge status: Home / Self Care | Payer: 59 | Source: Ambulatory Visit | Attending: Family Medicine | Admitting: Family Medicine

## 2013-04-21 ENCOUNTER — Encounter: Payer: Self-pay | Admitting: Family Medicine

## 2013-04-21 ENCOUNTER — Ambulatory Visit (INDEPENDENT_AMBULATORY_CARE_PROVIDER_SITE_OTHER): Payer: 59 | Admitting: Family Medicine

## 2013-04-21 VITALS — BP 122/86 | HR 69 | Ht 62.0 in | Wt 133.6 lb

## 2013-04-21 DIAGNOSIS — Z3009 Encounter for other general counseling and advice on contraception: Secondary | ICD-10-CM

## 2013-04-21 DIAGNOSIS — Z113 Encounter for screening for infections with a predominantly sexual mode of transmission: Secondary | ICD-10-CM | POA: Insufficient documentation

## 2013-04-21 DIAGNOSIS — J309 Allergic rhinitis, unspecified: Secondary | ICD-10-CM

## 2013-04-21 MED ORDER — CETIRIZINE HCL 10 MG PO CAPS: 10.0000 mg | ORAL_CAPSULE | Freq: Every day | ORAL | Status: DC

## 2013-04-21 MED ORDER — FLUTICASONE PROPIONATE 50 MCG/ACT NA SUSP: 2.0000 | Freq: Every day | NASAL | Status: DC

## 2013-04-21 NOTE — Patient Instructions (Signed)
Allergies - please start to take Zyrtec and Flonase  STD Check - Cr. Tejay Hubert will call you with your results  Contraception Choices Contraception (birth control) is the use of any methods or devices to prevent pregnancy. Below are some methods to help avoid pregnancy. HORMONAL METHODS   Contraceptive implant. This is a thin, plastic tube containing progesterone hormone. It does not contain estrogen hormone. Your caregiver inserts the tube in the inner part of the upper arm. The tube can remain in place for up to 3 years. After 3 years, the implant must be removed. The implant prevents the ovaries from releasing an egg (ovulation), thickens the cervical mucus which prevents sperm from entering the uterus, and thins the lining of the inside of the uterus.  Progesterone-only injections. These injections are given every 3 months by your caregiver to prevent pregnancy. This synthetic progesterone hormone stops the ovaries from releasing eggs. It also thickens cervical mucus and changes the uterine lining. This makes it harder for sperm to survive in the uterus.  Birth control pills. These pills contain estrogen and progesterone hormone. They work by stopping the egg from forming in the ovary (ovulation). Birth control pills are prescribed by a caregiver.Birth control pills can also be used to treat heavy periods.  Minipill. This type of birth control pill contains only the progesterone hormone. They are taken every day of each month and must be prescribed by your caregiver.  Birth control patch. The patch contains hormones similar to those in birth control pills. It must be changed once a week and is prescribed by a caregiver.  Vaginal ring. The ring contains hormones similar to those in birth control pills. It is left in the vagina for 3 weeks, removed for 1 week, and then a new one is put back in place. The patient must be comfortable inserting and removing the ring from the vagina.A caregiver's  prescription is necessary.  Emergency contraception. Emergency contraceptives prevent pregnancy after unprotected sexual intercourse. This pill can be taken right after sex or up to 5 days after unprotected sex. It is most effective the sooner you take the pills after having sexual intercourse. Emergency contraceptive pills are available without a prescription. Check with your pharmacist. Do not use emergency contraception as your only form of birth control. BARRIER METHODS   Female condom. This is a thin sheath (latex or rubber) that is worn over the penis during sexual intercourse. It can be used with spermicide to increase effectiveness.  Female condom. This is a soft, loose-fitting sheath that is put into the vagina before sexual intercourse.  Diaphragm. This is a soft, latex, dome-shaped barrier that must be fitted by a caregiver. It is inserted into the vagina, along with a spermicidal jelly. It is inserted before intercourse. The diaphragm should be left in the vagina for 6 to 8 hours after intercourse.  Cervical cap. This is a round, soft, latex or plastic cup that fits over the cervix and must be fitted by a caregiver. The cap can be left in place for up to 48 hours after intercourse.  Sponge. This is a soft, circular piece of polyurethane foam. The sponge has spermicide in it. It is inserted into the vagina after wetting it and before sexual intercourse.  Spermicides. These are chemicals that kill or block sperm from entering the cervix and uterus. They come in the form of creams, jellies, suppositories, foam, or tablets. They do not require a prescription. They are inserted into the vagina with  an applicator before having sexual intercourse. The process must be repeated every time you have sexual intercourse. INTRAUTERINE CONTRACEPTION  Intrauterine device (IUD). This is a T-shaped device that is put in a woman's uterus during a menstrual period to prevent pregnancy. There are 2  types:  Copper IUD. This type of IUD is wrapped in copper wire and is placed inside the uterus. Copper makes the uterus and fallopian tubes produce a fluid that kills sperm. It can stay in place for 10 years.  Hormone IUD. This type of IUD contains the hormone progestin (synthetic progesterone). The hormone thickens the cervical mucus and prevents sperm from entering the uterus, and it also thins the uterine lining to prevent implantation of a fertilized egg. The hormone can weaken or kill the sperm that get into the uterus. It can stay in place for 5 years. PERMANENT METHODS OF CONTRACEPTION  Female tubal ligation. This is when the woman's fallopian tubes are surgically sealed, tied, or blocked to prevent the egg from traveling to the uterus.  Female sterilization. This is when the female has the tubes that carry sperm tied off (vasectomy).This blocks sperm from entering the vagina during sexual intercourse. After the procedure, the man can still ejaculate fluid (semen). NATURAL PLANNING METHODS  Natural family planning. This is not having sexual intercourse or using a barrier method (condom, diaphragm, cervical cap) on days the woman could become pregnant.  Calendar method. This is keeping track of the length of each menstrual cycle and identifying when you are fertile.  Ovulation method. This is avoiding sexual intercourse during ovulation.  Symptothermal method. This is avoiding sexual intercourse during ovulation, using a thermometer and ovulation symptoms.  Post-ovulation method. This is timing sexual intercourse after you have ovulated. Regardless of which type or method of contraception you choose, it is important that you use condoms to protect against the transmission of sexually transmitted diseases (STDs). Talk with your caregiver about which form of contraception is most appropriate for you. Document Released: 06/04/2005 Document Revised: 08/27/2011 Document Reviewed:  10/11/2010 [facility] Patient Information 2014 [place], [place].

## 2013-04-21 NOTE — Assessment & Plan Note (Signed)
Patient presents for screening of sexual transmitted illness -Cultures for gonorrhea and Chlamydia obtained -HIV, RPR, acute hepatitis panel obtained -Wet mount was not obtained as the patient does not have active vaginal discharge or irritation

## 2013-04-21 NOTE — Assessment & Plan Note (Signed)
Birth control options were discussed with the patient. Informational handout was provided to the patient. She's not ready to make a decision on a form of birth control at this time and will followup with her primary care physician in regards to this matter

## 2013-04-21 NOTE — Assessment & Plan Note (Signed)
Patient presents for follow up of allergic rhinitis. She is currently symptomatic and has been out of her medications. -Refills provided for Zyrtec and Flonase

## 2013-04-21 NOTE — Progress Notes (Signed)
  Subjective:    Patient ID: Ariel Cardenas, female    DOB: 1990/11/18, 22 y.o.   MRN: [id number]  HPI 22 year old female presents for evaluation of multiple medical problems.  Allergies-patient has a history of allergic rhinitis, she states for the past few weeks is having increased sneezing, nasal congestion, and rhinorrhea, no associated fevers or chills, no cough, no sputum production, she was previously on Zyrtec and Flonase but been out of these medications for some time, she is interested in refills today  STD check-patient would like to be checked for STDs today, she reports that she's been in a monogamous relationship with her boyfriend for the past 8 years, she does question his fidelity and would like to be checked for STDs, she denies vaginal discharge or irritation today, she denies a history of sexual transmitted illnesses, her last menstrual period finished last week, she is not currently on birth control, was previously on Depo however had spotting and discontinued further treatment, she reports regular periods, she does report regular intercourse with her boyfriend, they do not use condoms on a regular basis   Review of Systems  Constitutional: Negative for fever, chills and fatigue.  HENT: Positive for congestion and rhinorrhea. Negative for ear discharge and ear pain.   Respiratory: Negative for cough and chest tightness.   Cardiovascular: Negative for chest pain.  Gastrointestinal: Negative for nausea, diarrhea and constipation.  Genitourinary: Negative for dysuria, vaginal bleeding, vaginal discharge, genital sores, vaginal pain and pelvic pain.       Objective:   Physical Exam Vitals: Reviewed  General: Pleasant [place] female, no acute distress, accompanied by her boyfriend HEENT: Normocephalic, pupils are equal round and reactive to light, extraocular movements are intact, no scleral icterus, nasal septum midline, mild bogginess of the nasal turbinates was appreciated,  moist mucous members, no pharyngeal erythema or exudate noted, neck was supple, no anterior posterior cervical lymphadenopathy was appreciated Cardiac: Regular rate and rhythm, S1 and S2 present, no murmurs, no heaves or thrills Respiratory: Clear to auscultation bilaterally, normal effort Genitourinary: Performed in the present of nurse, normal external vaginal examination without evidence of acute lesion , no vaginal discharge noted, speculum examination identified healthy-appearing cervix without discharge, gonorrhea and Chlamydia culture was obtained, bimanual examination was performed, no cervical motion tenderness was appreciated, no pelvic mass appreciated        Assessment & Plan:  Please see problem specific assessment and plan.

## 2013-04-22 LAB — HEPATITIS PANEL, ACUTE: Hep A IgM: NONREACTIVE

## 2013-04-22 LAB — RPR

## 2013-04-27 ENCOUNTER — Encounter: Payer: Self-pay | Admitting: Family Medicine

## 2013-09-12 ENCOUNTER — Emergency Department (HOSPITAL_COMMUNITY): Payer: 59

## 2013-09-12 ENCOUNTER — Emergency Department (HOSPITAL_COMMUNITY)
Admission: EM | Admit: 2013-09-12 | Discharge: 2013-09-12 | Disposition: A | Discharge status: Home / Self Care | Payer: 59 | Attending: Emergency Medicine | Admitting: Emergency Medicine

## 2013-09-12 ENCOUNTER — Encounter (HOSPITAL_COMMUNITY): Payer: Self-pay | Admitting: Emergency Medicine

## 2013-09-12 DIAGNOSIS — Z043 Encounter for examination and observation following other accident: Secondary | ICD-10-CM | POA: Insufficient documentation

## 2013-09-12 DIAGNOSIS — F10929 Alcohol use, unspecified with intoxication, unspecified: Secondary | ICD-10-CM

## 2013-09-12 DIAGNOSIS — M545 Low back pain, unspecified: Secondary | ICD-10-CM | POA: Insufficient documentation

## 2013-09-12 DIAGNOSIS — F1010 Alcohol abuse, uncomplicated: Secondary | ICD-10-CM | POA: Insufficient documentation

## 2013-09-12 DIAGNOSIS — R4182 Altered mental status, unspecified: Secondary | ICD-10-CM

## 2013-09-12 DIAGNOSIS — Z3202 Encounter for pregnancy test, result negative: Secondary | ICD-10-CM | POA: Insufficient documentation

## 2013-09-12 DIAGNOSIS — M549 Dorsalgia, unspecified: Secondary | ICD-10-CM

## 2013-09-12 LAB — CBC WITH DIFFERENTIAL/PLATELET
BASOS ABS: 0 10*3/uL (ref 0.0–0.1)
Basophils Relative: 1 % (ref 0–1)
Eosinophils Absolute: 0.1 10*3/uL (ref 0.0–0.7)
Eosinophils Relative: 1 % (ref 0–5)
HCT: 38.1 % (ref 36.0–46.0)
Hemoglobin: 12.9 g/dL (ref 12.0–15.0)
LYMPHS PCT: 39 % (ref 12–46)
Lymphs Abs: 2.3 10*3/uL (ref 0.7–4.0)
MCH: 27 pg (ref 26.0–34.0)
MCHC: 33.9 g/dL (ref 30.0–36.0)
MCV: 79.9 fL (ref 78.0–100.0)
Monocytes Absolute: 0.3 10*3/uL (ref 0.1–1.0)
Monocytes Relative: 6 % (ref 3–12)
NEUTROS ABS: 3.2 10*3/uL (ref 1.7–7.7)
Neutrophils Relative %: 54 % (ref 43–77)
PLATELETS: 276 10*3/uL (ref 150–400)
RBC: 4.77 MIL/uL (ref 3.87–5.11)
RDW: 12.7 % (ref 11.5–15.5)
WBC: 5.8 10*3/uL (ref 4.0–10.5)

## 2013-09-12 LAB — URINALYSIS, ROUTINE W REFLEX MICROSCOPIC
Bilirubin Urine: NEGATIVE
Glucose, UA: NEGATIVE mg/dL
HGB URINE DIPSTICK: NEGATIVE
Ketones, ur: NEGATIVE mg/dL
LEUKOCYTES UA: NEGATIVE
NITRITE: NEGATIVE
PROTEIN: NEGATIVE mg/dL
SPECIFIC GRAVITY, URINE: 1.008 (ref 1.005–1.030)
UROBILINOGEN UA: 0.2 mg/dL (ref 0.0–1.0)
pH: 7 (ref 5.0–8.0)

## 2013-09-12 LAB — ACETAMINOPHEN LEVEL

## 2013-09-12 LAB — CBG MONITORING, ED: GLUCOSE-CAPILLARY: 99 mg/dL (ref 70–99)

## 2013-09-12 LAB — I-STAT CHEM 8, ED
BUN: 4 mg/dL — AB (ref 6–23)
Calcium, Ion: 1.17 mmol/L (ref 1.12–1.23)
Chloride: 106 mEq/L (ref 96–112)
Creatinine, Ser: 0.9 mg/dL (ref 0.50–1.10)
Glucose, Bld: 95 mg/dL (ref 70–99)
HCT: 41 % (ref 36.0–46.0)
Hemoglobin: 13.9 g/dL (ref 12.0–15.0)
Potassium: 3.2 mEq/L — ABNORMAL LOW (ref 3.7–5.3)
SODIUM: 146 meq/L (ref 137–147)
TCO2: 23 mmol/L (ref 0–100)

## 2013-09-12 LAB — RAPID URINE DRUG SCREEN, HOSP PERFORMED
Amphetamines: NOT DETECTED
Barbiturates: NOT DETECTED
Benzodiazepines: NOT DETECTED
COCAINE: NOT DETECTED
Opiates: NOT DETECTED
TETRAHYDROCANNABINOL: NOT DETECTED

## 2013-09-12 LAB — SALICYLATE LEVEL: Salicylate Lvl: <2 mg/dL — ABNORMAL LOW (ref 2.8–20.0)

## 2013-09-12 LAB — PREGNANCY, URINE: Preg Test, Ur: NEGATIVE

## 2013-09-12 LAB — ETHANOL: Alcohol, Ethyl (B): 182 mg/dL — ABNORMAL HIGH (ref 0–11)

## 2013-09-12 MED ORDER — IBUPROFEN 800 MG PO TABS
800.0000 mg | ORAL_TABLET | Freq: Once | ORAL | Status: AC
  Administered 2013-09-12: 800 mg via ORAL
  Filled 2013-09-12: qty 1

## 2013-09-12 MED ORDER — ACETAMINOPHEN 325 MG PO TABS
650.0000 mg | ORAL_TABLET | Freq: Once | ORAL | Status: AC
  Administered 2013-09-12: 650 mg via ORAL
  Filled 2013-09-12: qty 2
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
## 2013-09-12 NOTE — Discharge Instructions (Signed)
Take ibuprofen and tylenol for pain, ice every 4 hrs. If you were given medicines take as directed.  If you are on coumadin or contraceptives realize their levels and effectiveness is altered by many different medicines.  If you have any reaction (rash, tongues swelling, other) to the medicines stop taking and see a physician.   Please follow up as directed and return to the ER or see a physician for new or worsening symptoms.  Thank you. Filed Vitals:   09/12/13 0330 09/12/13 0345 09/12/13 0400 09/12/13 0531  BP: 114/87 122/77 105/71 121/88  Pulse: 84 78 89 95  Temp:      TempSrc:      Resp: 17 20 19    SpO2: 100% 100% 100% 100%

## 2013-09-12 NOTE — ED Notes (Signed)
MD at bedside. 
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
## 2013-09-14 ENCOUNTER — Encounter: Payer: Self-pay | Admitting: Family Medicine

## 2013-09-16 ENCOUNTER — Encounter: Payer: Self-pay | Admitting: Family Medicine

## 2013-09-16 ENCOUNTER — Ambulatory Visit (INDEPENDENT_AMBULATORY_CARE_PROVIDER_SITE_OTHER): Payer: 59 | Admitting: Family Medicine

## 2013-09-16 VITALS — BP 120/84 | HR 69 | Temp 98.4°F | Wt 133.0 lb

## 2013-09-16 DIAGNOSIS — Z043 Encounter for examination and observation following other accident: Secondary | ICD-10-CM

## 2013-09-16 DIAGNOSIS — Z041 Encounter for examination and observation following transport accident: Secondary | ICD-10-CM
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
## 2013-09-17 DIAGNOSIS — Z041 Encounter for examination and observation following transport accident: Secondary | ICD-10-CM | POA: Insufficient documentation

## 2013-09-17 DIAGNOSIS — Z043 Encounter for examination and observation following other accident: Principal | ICD-10-CM

## 2013-09-17 MED ORDER — IBUPROFEN 400 MG PO TABS: 400.0000 mg | ORAL_TABLET | ORAL | Status: DC | PRN

## 2013-09-17 NOTE — Assessment & Plan Note (Addendum)
Likely lumbar and cervical spine strain. No signs of instability, no bony tenderness suggesting fracture, no red flag symptoms. - Increase ibuprofen to 400mg  q4-6 hours prn x 2 weeks. - Heat/ice/massage. - Continue to move but rest and no heavy lifting. - Work note provided. - Return precautions reviewed. - Return in 2 weeks PRN no improvement.

## 2013-11-16 ENCOUNTER — Encounter: Payer: Self-pay | Admitting: Family Medicine

## 2013-11-16 ENCOUNTER — Ambulatory Visit (INDEPENDENT_AMBULATORY_CARE_PROVIDER_SITE_OTHER): Payer: 59 | Admitting: Family Medicine

## 2013-11-16 VITALS — BP 110/77 | HR 79 | Temp 99.8°F | Ht 62.0 in | Wt 130.0 lb

## 2013-11-16 DIAGNOSIS — Z3009 Encounter for other general counseling and advice on contraception: Secondary | ICD-10-CM

## 2013-11-16 DIAGNOSIS — Z3046 Encounter for surveillance of implantable subdermal contraceptive: Secondary | ICD-10-CM

## 2013-11-16 DIAGNOSIS — Z30017 Encounter for initial prescription of implantable subdermal contraceptive: Secondary | ICD-10-CM

## 2013-11-16 LAB — POCT URINE PREGNANCY: Preg Test, Ur: NEGATIVE

## 2013-11-16 MED ORDER — ETONOGESTREL 68 MG ~~LOC~~ IMPL
68.0000 mg | DRUG_IMPLANT | Freq: Once | SUBCUTANEOUS | Status: AC
  Administered 2013-11-16: 68 mg via SUBCUTANEOUS

## 2013-11-16 NOTE — Patient Instructions (Signed)
Keep the strips in place until they fall of on their own.   Keep your pressure dressing on for the next 4 hours.

## 2013-11-16 NOTE — Addendum Note (Signed)
Addended by: Jone Baseman D on: 11/16/2013 03:29 PM   Modules accepted: Orders

## 2013-11-16 NOTE — Progress Notes (Signed)
Patient ID: Ariel Cardenas, female   DOB: Oct 25, 1990, 23 y.o.   MRN: [id number]  PROCEDURE NOTE: NEXPLANON  INSERTION Patient given informed consent and signed copy in the chart.  Pregnancy test was negative  Appropriate time out was taken. Left arm was prepped and draped in the usual sterile fashion. Appropriate measurement was made for insertion of nexplanon and landmarks identified, insertion site marked. Two cc of 1%lidocaine  was used for local anesthesia. Once anesthesia obtained, nexplanon was inserted in typical fashion. No complications. Pressure bandage applied to decrease bruising. Patient given follow up instructions should she experience redness, swelling at sight or fever in the next 24 hours. Patient given Nexplanon pocket card.

## 2014-02-24 ENCOUNTER — Ambulatory Visit (INDEPENDENT_AMBULATORY_CARE_PROVIDER_SITE_OTHER): Payer: 59 | Admitting: *Deleted

## 2014-02-24 DIAGNOSIS — Z4802 Encounter for removal of sutures: Secondary | ICD-10-CM

## 2014-02-24 NOTE — Progress Notes (Signed)
   Pt in nurse clinic for suture removal to area on left leg. Pt had three sutures placed 02/15/2014 at [facility] ER.  Area slight bruised, no drainage, swelling noticed.  Three sutures removed without difficultly.  Pt advised to follow up with PCP if any increase redness, swelling, drainage or fever.  Pt stated understanding.  Clovis Pu, RN

## 2017-01-21 ENCOUNTER — Encounter (HOSPITAL_BASED_OUTPATIENT_CLINIC_OR_DEPARTMENT_OTHER): Payer: Self-pay

## 2017-01-21 ENCOUNTER — Emergency Department (HOSPITAL_BASED_OUTPATIENT_CLINIC_OR_DEPARTMENT_OTHER)
Admission: EM | Admit: 2017-01-21 | Discharge: 2017-01-21 | Disposition: A | Discharge status: Home / Self Care | Payer: Self-pay | Attending: Emergency Medicine | Admitting: Emergency Medicine

## 2017-01-21 ENCOUNTER — Emergency Department (HOSPITAL_BASED_OUTPATIENT_CLINIC_OR_DEPARTMENT_OTHER): Payer: Self-pay

## 2017-01-21 DIAGNOSIS — R112 Nausea with vomiting, unspecified: Secondary | ICD-10-CM | POA: Insufficient documentation

## 2017-01-21 DIAGNOSIS — K8020 Calculus of gallbladder without cholecystitis without obstruction: Secondary | ICD-10-CM | POA: Insufficient documentation

## 2017-01-21 DIAGNOSIS — K8050 Calculus of bile duct without cholangitis or cholecystitis without obstruction: Secondary | ICD-10-CM

## 2017-01-21 DIAGNOSIS — R1013 Epigastric pain: Secondary | ICD-10-CM

## 2017-01-21 LAB — CBC WITH DIFFERENTIAL/PLATELET
Basophils Absolute: 0 10*3/uL (ref 0.0–0.1)
Basophils Relative: 0 %
Eosinophils Absolute: 0 10*3/uL (ref 0.0–0.7)
Eosinophils Relative: 0 %
HCT: 38.1 % (ref 36.0–46.0)
Hemoglobin: 12.4 g/dL (ref 12.0–15.0)
Lymphocytes Relative: 8 %
Lymphs Abs: 0.9 10*3/uL (ref 0.7–4.0)
MCH: 26.1 pg (ref 26.0–34.0)
MCHC: 32.5 g/dL (ref 30.0–36.0)
MCV: 80.2 fL (ref 78.0–100.0)
Monocytes Absolute: 0.5 10*3/uL (ref 0.1–1.0)
Monocytes Relative: 5 %
Neutro Abs: 8.8 10*3/uL — ABNORMAL HIGH (ref 1.7–7.7)
Neutrophils Relative %: 87 %
Platelets: 319 10*3/uL (ref 150–400)
RBC: 4.75 MIL/uL (ref 3.87–5.11)
RDW: 13 % (ref 11.5–15.5)
WBC: 10.2 10*3/uL (ref 4.0–10.5)

## 2017-01-21 LAB — COMPREHENSIVE METABOLIC PANEL
ALBUMIN: 4.4 g/dL (ref 3.5–5.0)
ALT: 180 U/L — ABNORMAL HIGH (ref 14–54)
ANION GAP: 11 (ref 5–15)
AST: 175 U/L — ABNORMAL HIGH (ref 15–41)
Alkaline Phosphatase: 63 U/L (ref 38–126)
BUN: 11 mg/dL (ref 6–20)
CO2: 23 mmol/L (ref 22–32)
Calcium: 8.8 mg/dL — ABNORMAL LOW (ref 8.9–10.3)
Chloride: 103 mmol/L (ref 101–111)
Creatinine, Ser: 0.5 mg/dL (ref 0.44–1.00)
Glucose, Bld: 98 mg/dL (ref 65–99)
POTASSIUM: 3.7 mmol/L (ref 3.5–5.1)
Sodium: 137 mmol/L (ref 135–145)
TOTAL PROTEIN: 7.8 g/dL (ref 6.5–8.1)
Total Bilirubin: 2.1 mg/dL — ABNORMAL HIGH (ref 0.3–1.2)

## 2017-01-21 LAB — LIPASE, BLOOD: LIPASE: 24 U/L (ref 11–51)

## 2017-01-21 MED ORDER — IBUPROFEN 600 MG PO TABS: 600.0000 mg | ORAL_TABLET | Freq: Four times a day (QID) | ORAL | 0 refills | Status: DC | PRN

## 2017-01-21 MED ORDER — HYDROCODONE-ACETAMINOPHEN 5-325 MG PO TABS: 1.0000 | ORAL_TABLET | Freq: Four times a day (QID) | ORAL | 0 refills | Status: DC | PRN

## 2017-01-21 MED ORDER — ONDANSETRON HCL 4 MG/2ML IJ SOLN
4.0000 mg | Freq: Once | INTRAMUSCULAR | Status: AC
  Administered 2017-01-21: 4 mg via INTRAVENOUS
  Filled 2017-01-21: qty 2

## 2017-01-21 MED ORDER — ONDANSETRON 4 MG PO TBDP: 4.0000 mg | ORAL_TABLET | Freq: Once | ORAL | Status: DC

## 2017-01-21 MED ORDER — ONDANSETRON HCL 4 MG PO TABS: 4.0000 mg | ORAL_TABLET | Freq: Four times a day (QID) | ORAL | 0 refills | Status: DC

## 2017-01-21 MED FILL — ONDANSETRON HCL 4 MG TABLET: 4 | 3 days supply | Qty: 12 | Fill #0

## 2017-01-21 MED FILL — HYDROCODON-APAP 5-325: 5-325 | 3 days supply | Qty: 12 | Fill #0

## 2017-01-21 MED FILL — IBUPROFEN 600 MG TABLET: 600 | 7 days supply | Qty: 30 | Fill #0

## 2017-01-21 NOTE — ED Triage Notes (Signed)
C/o "heart burn" x 1 year-worse x 1 week-taking zantac and prilosec-NAD-steady gait

## 2017-01-21 NOTE — ED Notes (Signed)
Returned from [facility]

## 2017-01-21 NOTE — Discharge Instructions (Signed)
Medications: Norco, Zofran, ibuprofen  Treatment: Take 1-2 Norco every 4-6 hours as needed for severe pain. Take ibuprofen every 6 hours as needed for mild to moderate pain. Take Zofran every 6 hours as needed for nausea or vomiting.  Do not drink alcohol, drive, operate machinery or participate in any other potentially dangerous activities while taking opiate pain medication as it may make you sleepy. Do not take this medication with any other sedating medications, either prescription or over-the-counter. If you were prescribed Percocet or Vicodin, do not take these with acetaminophen (Tylenol) as it is already contained within these medications and overdose of Tylenol is dangerous.   This medication is an opiate (or narcotic) pain medication and can be habit forming.  Use it as little as possible to achieve adequate pain control.  Do not use or use it with extreme caution if you have a history of opiate abuse or dependence. This medication is intended for your use only - do not give any to anyone else and keep it in a secure place where nobody else, especially children, have access to it. It will also cause or worsen constipation, so you may want to consider taking an over-the-counter stool softener while you are taking this medication.  Follow-up: Please follow-up with Dr. Tawana ScaleWilson's office for further evaluation and treatment. Please return to emergency department if you develop any new or worsening symptoms including pain lasting over 12 hours, intractable vomiting despite medication, fever over 100.4, or any other new or concerning symptoms.

## 2017-01-21 NOTE — ED Notes (Signed)
Patient transported to Ultrasound 
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
## 2018-02-23 ENCOUNTER — Inpatient Hospital Stay (HOSPITAL_COMMUNITY)
Admission: AD | Admit: 2018-02-23 | Discharge: 2018-02-23 | Disposition: A | Discharge status: Home / Self Care | Payer: Medicaid Other | Attending: Obstetrics and Gynecology | Admitting: Obstetrics and Gynecology

## 2018-02-23 ENCOUNTER — Inpatient Hospital Stay (HOSPITAL_COMMUNITY): Payer: Medicaid Other

## 2018-02-23 ENCOUNTER — Encounter (HOSPITAL_COMMUNITY): Payer: Self-pay

## 2018-02-23 DIAGNOSIS — O26891 Other specified pregnancy related conditions, first trimester: Secondary | ICD-10-CM | POA: Diagnosis not present

## 2018-02-23 DIAGNOSIS — O209 Hemorrhage in early pregnancy, unspecified: Secondary | ICD-10-CM | POA: Diagnosis present

## 2018-02-23 DIAGNOSIS — Z3A01 Less than 8 weeks gestation of pregnancy: Secondary | ICD-10-CM | POA: Insufficient documentation

## 2018-02-23 DIAGNOSIS — O3481 Maternal care for other abnormalities of pelvic organs, first trimester: Secondary | ICD-10-CM | POA: Insufficient documentation

## 2018-02-23 DIAGNOSIS — O23591 Infection of other part of genital tract in pregnancy, first trimester: Secondary | ICD-10-CM | POA: Diagnosis not present

## 2018-02-23 DIAGNOSIS — B9689 Other specified bacterial agents as the cause of diseases classified elsewhere: Secondary | ICD-10-CM | POA: Diagnosis not present

## 2018-02-23 DIAGNOSIS — N83201 Unspecified ovarian cyst, right side: Secondary | ICD-10-CM | POA: Insufficient documentation

## 2018-02-23 DIAGNOSIS — O208 Other hemorrhage in early pregnancy: Secondary | ICD-10-CM

## 2018-02-23 DIAGNOSIS — N888 Other specified noninflammatory disorders of cervix uteri: Secondary | ICD-10-CM

## 2018-02-23 DIAGNOSIS — Z79899 Other long term (current) drug therapy: Secondary | ICD-10-CM | POA: Insufficient documentation

## 2018-02-23 DIAGNOSIS — N760 Acute vaginitis: Secondary | ICD-10-CM

## 2018-02-23 DIAGNOSIS — R109 Unspecified abdominal pain: Secondary | ICD-10-CM | POA: Diagnosis present

## 2018-02-23 LAB — WET PREP, GENITAL
Sperm: NONE SEEN
Trich, Wet Prep: NONE SEEN
Yeast Wet Prep HPF POC: NONE SEEN

## 2018-02-23 LAB — URINALYSIS, ROUTINE W REFLEX MICROSCOPIC
BILIRUBIN URINE: NEGATIVE
GLUCOSE, UA: NEGATIVE mg/dL
HGB URINE DIPSTICK: NEGATIVE
KETONES UR: NEGATIVE mg/dL
Leukocytes, UA: NEGATIVE
Nitrite: NEGATIVE
PH: 7 (ref 5.0–8.0)
Protein, ur: NEGATIVE mg/dL
Specific Gravity, Urine: 1.002 — ABNORMAL LOW (ref 1.005–1.030)

## 2018-02-23 LAB — HCG, QUANTITATIVE, PREGNANCY: hCG, Beta Chain, Quant, S: 25988 m[IU]/mL — ABNORMAL HIGH (ref <5)

## 2018-02-23 LAB — CBC
HCT: 36.4 % (ref 36.0–46.0)
Hemoglobin: 12 g/dL (ref 12.0–15.0)
MCH: 26.1 pg (ref 26.0–34.0)
MCHC: 33 g/dL (ref 30.0–36.0)
MCV: 79.3 fL (ref 78.0–100.0)
PLATELETS: 272 10*3/uL (ref 150–400)
RBC: 4.59 MIL/uL (ref 3.87–5.11)
RDW: 12.5 % (ref 11.5–15.5)
WBC: 6.5 10*3/uL (ref 4.0–10.5)

## 2018-02-23 LAB — ABO/RH: ABO/RH(D): O POS

## 2018-02-23 MED ORDER — CONCEPT OB 130-92.4-1 MG PO CAPS: 1.0000 | ORAL_CAPSULE | Freq: Every day | ORAL | 12 refills | Status: DC

## 2018-02-23 MED ORDER — METRONIDAZOLE 500 MG PO TABS: 500.0000 mg | ORAL_TABLET | Freq: Two times a day (BID) | ORAL | 0 refills | Status: DC

## 2018-02-23 NOTE — Discharge Instructions (Signed)
Your bleeding appears to be coming from your cervix.  Your ultrasound shows that your pregnancy is growing in your uterus and is therefore not an ectopic pregnancy.  It is too early to get an accurate measurement of how many weeks you are or to see the baby's heartbeat.  Vaginal Bleeding During Pregnancy, First Trimester A small amount of bleeding (spotting) from the vagina is common in early pregnancy. Sometimes the bleeding is normal and is not a problem, and sometimes it is a sign of something serious. Be sure to tell your doctor about any bleeding from your vagina right away. Follow these instructions at home:  Watch your condition for any changes.  Follow your doctor's instructions about how active you can be.  If you are on bed rest: ? You may need to stay in bed and only get up to use the bathroom. ? You may be allowed to do some activities. ? If you need help, make plans for someone to help you.  Write down: ? The number of pads you use each day. ? How often you change pads. ? How soaked (saturated) your pads are.  Do not use tampons.  Do not douche.  Do not have sex or orgasms until your doctor says it is okay.  If you pass any tissue from your vagina, save the tissue so you can show it to your doctor.  Only take medicines as told by your doctor.  Do not take aspirin because it can make you bleed.  Keep all follow-up visits as told by your doctor. Contact a doctor if:  You bleed from your vagina.  You have cramps.  You have labor pains.  You have a fever that does not go away after you take medicine. Get help right away if:  You have very bad cramps in your back or belly (abdomen).  You pass large clots or tissue from your vagina.  You bleed more.  You feel light-headed or weak.  You pass out (faint).  You have chills.  You are leaking fluid or have a gush of fluid from your vagina.  You pass out while pooping (having a bowel movement). This  information is not intended to replace advice given to you by your health care provider. Make sure you discuss any questions you have with your health care provider. Document Released: 10/19/2013 Document Revised: 11/10/2015 Document Reviewed: 02/09/2013 Elsevier Interactive Patient Education  2018 [employer].    Bacterial Vaginosis Bacterial vaginosis is a vaginal infection that occurs when the normal balance of bacteria in the vagina is disrupted. It results from an overgrowth of certain bacteria. This is the most common vaginal infection among women ages 92-44. Because bacterial vaginosis increases your risk for STIs (sexually transmitted infections), getting treated can help reduce your risk for chlamydia, gonorrhea, herpes, and HIV (human immunodeficiency virus). Treatment is also important for preventing complications in pregnant women, because this condition can cause an early (premature) delivery. What are the causes? This condition is caused by an increase in harmful bacteria that are normally present in small amounts in the vagina. However, the reason that the condition develops is not fully understood. What increases the risk? The following factors may make you more likely to develop this condition:  Having a new sexual partner or multiple sexual partners.  Having unprotected sex.  Douching.  Having an intrauterine device (IUD).  Smoking.  Drug and alcohol abuse.  Taking certain antibiotic medicines.  Being pregnant.  You cannot get  bacterial vaginosis from toilet seats, bedding, swimming pools, or contact with objects around you. What are the signs or symptoms? Symptoms of this condition include:  Grey or white vaginal discharge. The discharge can also be watery or foamy.  A fish-like odor with discharge, especially after sexual intercourse or during menstruation.  Itching in and around the vagina.  Burning or pain with urination.  Some women with bacterial  vaginosis have no signs or symptoms. How is this diagnosed? This condition is diagnosed based on:  Your medical history.  A physical exam of the vagina.  Testing a sample of vaginal fluid under a microscope to look for a large amount of bad bacteria or abnormal cells. Your health care provider may use a cotton swab or a small wooden spatula to collect the sample.  How is this treated? This condition is treated with antibiotics. These may be given as a pill, a vaginal cream, or a medicine that is put into the vagina (suppository). If the condition comes back after treatment, a second round of antibiotics may be needed. Follow these instructions at home: Medicines  Take over-the-counter and prescription medicines only as told by your health care provider.  Take or use your antibiotic as told by your health care provider. Do not stop taking or using the antibiotic even if you start to feel better. General instructions  If you have a female sexual partner, tell her that you have a vaginal infection. She should see her health care provider and be treated if she has symptoms. If you have a female sexual partner, he does not need treatment.  During treatment: ? Avoid sexual activity until you finish treatment. ? Do not douche. ? Avoid alcohol as directed by your health care provider. ? Avoid breastfeeding as directed by your health care provider.  Drink enough water and fluids to keep your urine clear or pale yellow.  Keep the area around your vagina and rectum clean. ? Wash the area daily with warm water. ? Wipe yourself from front to back after using the toilet.  Keep all follow-up visits as told by your health care provider. This is important. How is this prevented?  Do not douche.  Wash the outside of your vagina with warm water only.  Use protection when having sex. This includes latex condoms and dental dams.  Limit how many sexual partners you have. To help prevent bacterial  vaginosis, it is best to have sex with just one partner (monogamous).  Make sure you and your sexual partner are tested for STIs.  Wear cotton or cotton-lined underwear.  Avoid wearing tight pants and pantyhose, especially during summer.  Limit the amount of alcohol that you drink.  Do not use any products that contain nicotine or tobacco, such as cigarettes and e-cigarettes. If you need help quitting, ask your health care provider.  Do not use illegal drugs. Where to find more information:  Centers for Disease Control and Prevention: SolutionApps.co.za  American Sexual Health Association (ASHA): www.ashastd.org  [place] Department of Health and Health and safety inspector, Office on Women's Health: ConventionalMedicines.si or http://www.anderson-williamson.info/ Contact a health care provider if:  Your symptoms do not improve, even after treatment.  You have more discharge or pain when urinating.  You have a fever.  You have pain in your abdomen.  You have pain during sex.  You have vaginal bleeding between periods. Summary  Bacterial vaginosis is a vaginal infection that occurs when the normal balance of bacteria in the vagina is disrupted.  Because bacterial vaginosis increases your risk for STIs (sexually transmitted infections), getting treated can help reduce your risk for chlamydia, gonorrhea, herpes, and HIV (human immunodeficiency virus). Treatment is also important for preventing complications in pregnant women, because the condition can cause an early (premature) delivery.  This condition is treated with antibiotic medicines. These may be given as a pill, a vaginal cream, or a medicine that is put into the vagina (suppository). This information is not intended to replace advice given to you by your health care provider. Make sure you discuss any questions you have with your health care provider. Document Released: 06/04/2005 Document Revised: 10/08/2016 Document  Reviewed: 02/18/2016 Elsevier Interactive Patient Education  [employer].

## 2018-02-23 NOTE — MAU Provider Note (Signed)
Chief Complaint: Vaginal Bleeding   First Provider Initiated Contact with Patient 02/23/18 1021     SUBJECTIVE HPI: Ariel Cardenas is a 27 y.o. G1P0 at 108w1d who presents to Maternity Admissions reporting: spotting and cramping x 2 days. Pos UPT at [facility]. No other testing.  Vaginal Bleeding: Spotting Passage of tissue or clots: denies Dizziness: denies  O POS Performed at [facility], [address], [place], [place] [address]   Pain Location: SP Quality: cramping Severity: mild  Duration: 2 days Course: unchanged bleeding. No cramping currently Context: early pregnancy Timing: intermittent Modifying factors: none. Hasn't tried anything Associated signs and symptoms: Pos for VB. Neg for fever, chills, passage of clots or tissue, urinary complaints, GI complaints.   History reviewed. No pertinent past medical history. OB History  Gravida Para Term Preterm AB Living  1            SAB TAB Ectopic Multiple Live Births               # Outcome Date GA Lbr Len/2nd Weight Sex Delivery Anes PTL Lv  1 Current            Past Surgical History:  Procedure Laterality Date  . BACK SURGERY     Social History   Socioeconomic History  . Marital status: Single    Spouse name: Not on file  . Number of children: Not on file  . Years of education: Not on file  . Highest education level: Not on file  Occupational History  . Not on file  Social Needs  . Financial resource strain: Not on file  . Food insecurity:    Worry: Not on file    Inability: Not on file  . Transportation needs:    Medical: Not on file    Non-medical: Not on file  Tobacco Use  . Smoking status: Never Smoker  . Smokeless tobacco: Never Used  Substance and Sexual Activity  . Alcohol use: No  . Drug use: No  . Sexual activity: Not on file  Lifestyle  . Physical activity:    Days per week: Not on file    Minutes per session: Not on file  . Stress: Not on file  Relationships  . Social  connections:    Talks on phone: Not on file    Gets together: Not on file    Attends religious service: Not on file    Active member of club or organization: Not on file    Attends meetings of clubs or organizations: Not on file    Relationship status: Not on file  . Intimate partner violence:    Fear of current or ex partner: Not on file    Emotionally abused: Not on file    Physically abused: Not on file    Forced sexual activity: Not on file  Other Topics Concern  . Not on file  Social History Narrative   ** Merged History Encounter **       No current facility-administered medications on file prior to encounter.    Current Outpatient Medications on File Prior to Encounter  Medication Sig Dispense Refill  . HYDROcodone-acetaminophen (NORCO/VICODIN) 5-325 MG tablet Take 1-2 tablets by mouth every 6 (six) hours as needed for severe pain. 12 tablet 0  . ibuprofen (ADVIL,MOTRIN) 600 MG tablet Take 1 tablet (600 mg total) by mouth every 6 (six) hours as needed. 30 tablet 0  . ondansetron (ZOFRAN) 4 MG tablet Take 1 tablet (4 mg  total) by mouth every 6 (six) hours. 12 tablet 0   No Known Allergies  I have reviewed the past Medical Hx, Surgical Hx, Social Hx, Allergies and Medications.   Review of Systems  Constitutional: Negative for chills and fever.  Gastrointestinal: Positive for abdominal pain. Negative for abdominal distention, constipation, diarrhea, nausea and vomiting.  Genitourinary: Positive for vaginal bleeding. Negative for dysuria, flank pain, frequency, hematuria and vaginal discharge.  Neurological: Negative for dizziness.    OBJECTIVE Patient Vitals for the past 24 hrs:  BP Temp Temp src Pulse Resp Height Weight  02/23/18 0950 127/81 98.1 F (36.7 C) Oral 89 18 5\' 1"  (1.549 m) 72.1 kg   Constitutional: Well-developed, well-nourished female in no acute distress.  Cardiovascular: normal rate Respiratory: normal rate and effort.  GI: Abd soft, non-tender.  MS:  Extremities nontender, no edema, normal ROM Neurologic: Alert and oriented x 4.  GU: Neg CVAT.  SPECULUM EXAM: NEFG, moderate amount of tan, malodorous discharge, no active bleeding initially, but cervix bled easily w/ swabs. No mucopurulent discharge  BIMANUAL: cervix closed; UTA uterine size due to body habitus. Not obviously enlarged. No adnexal tenderness or masses. No CMT.  LAB RESULTS Results for orders placed or performed during the hospital encounter of 02/23/18 (from the past 24 hour(s))  Urinalysis, Routine w reflex microscopic     Status: Abnormal   Collection Time: 02/23/18 10:04 AM  Result Value Ref Range   Color, Urine STRAW (A) YELLOW   APPearance CLEAR CLEAR   Specific Gravity, Urine 1.002 (L) 1.005 - 1.030   pH 7.0 5.0 - 8.0   Glucose, UA NEGATIVE NEGATIVE mg/dL   Hgb urine dipstick NEGATIVE NEGATIVE   Bilirubin Urine NEGATIVE NEGATIVE   Ketones, ur NEGATIVE NEGATIVE mg/dL   Protein, ur NEGATIVE NEGATIVE mg/dL   Nitrite NEGATIVE NEGATIVE   Leukocytes, UA NEGATIVE NEGATIVE  Wet prep, genital     Status: Abnormal   Collection Time: 02/23/18 10:30 AM  Result Value Ref Range   Yeast Wet Prep HPF POC NONE SEEN NONE SEEN   Trich, Wet Prep NONE SEEN NONE SEEN   Clue Cells Wet Prep HPF POC PRESENT (A) NONE SEEN   WBC, Wet Prep HPF POC MODERATE (A) NONE SEEN   Sperm NONE SEEN   hCG, quantitative, pregnancy     Status: Abnormal   Collection Time: 02/23/18 10:41 AM  Result Value Ref Range   hCG, Beta Chain, Quant, S 25,988 (H) <5 mIU/mL  CBC     Status: None   Collection Time: 02/23/18 10:41 AM  Result Value Ref Range   WBC 6.5 4.0 - 10.5 K/uL   RBC 4.59 3.87 - 5.11 MIL/uL   Hemoglobin 12.0 12.0 - 15.0 g/dL   HCT 93.8 10.1 - 75.1 %   MCV 79.3 78.0 - 100.0 fL   MCH 26.1 26.0 - 34.0 pg   MCHC 33.0 30.0 - 36.0 g/dL   RDW 02.5 85.2 - 77.8 %   Platelets 272 150 - 400 K/uL  ABO/Rh     Status: None (Preliminary result)   Collection Time: 02/23/18 10:41 AM  Result Value  Ref Range   ABO/RH(D)      O POS Performed at [facility], [address], [place], [place] [address]     IMAGING US Ob Comp Less 14 Wks  Result Date: 02/23/2018 CLINICAL DATA:  Vaginal bleeding.  Early pregnancy. EXAM: OBSTETRIC <14 WK [place] AND TRANSVAGINAL OB US TECHNIQUE: Both transabdominal and transvaginal ultrasound examinations were  performed for complete evaluation of the gestation as well as the maternal uterus, adnexal regions, and pelvic cul-de-sac. Transvaginal technique was performed to assess early pregnancy. COMPARISON:  None. FINDINGS: Intrauterine gestational sac: Single Yolk sac:  Visualized. Embryo:  Visualized. Cardiac Activity: Not definitively Heart Rate:   bpm MSD: 15 mm   6 w   2 d CRL:  1.2 mm    w    d                  [place] EDC: Subchorionic hemorrhage:  None visualized. Maternal uterus/adnexae: There is a dominant cyst in the region of the right ovary measuring 4.6 x 3.6 x 2.6 cm. Immediately adjacent to the cyst is a rounded solid region measuring 2 x 2 x 2.2 cm which is centrally hyperechoic with blood flow. IMPRESSION: 1. There is a single live IUP. By last menstrual period, the gestational age is 7 weeks and 1 day. By mean sac diameter, the gestational age is 6 weeks and 2 days. By crown-rump length, the gestational age cannot be assessed due to its tiny size but is less than 5 weeks and 5 days. No definite cardiac activity seen within the tiny suspected fetal pole. Recommend close attention on follow-up to assess viability. 2. There is a dominant cyst in the right adnexa. Adjacent to this cyst is a rounded solid hypoechoic region measuring 2 x 2 x 2.2 cm, hyperechoic centrally. I suspect both the cystic and solid components are likely ovarian. An ectopic pregnancy would be unusual and unlikely given the visualized intrauterine pregnancy. Recommend attention on follow-up. Electronically Signed   By: Gerome Sam III M.D   On: 02/23/2018 12:33   US Ob  Transvaginal  Result Date: 02/23/2018 CLINICAL DATA:  Vaginal bleeding.  Early pregnancy. EXAM: OBSTETRIC <14 WK [place] AND TRANSVAGINAL OB US TECHNIQUE: Both transabdominal and transvaginal ultrasound examinations were performed for complete evaluation of the gestation as well as the maternal uterus, adnexal regions, and pelvic cul-de-sac. Transvaginal technique was performed to assess early pregnancy. COMPARISON:  None. FINDINGS: Intrauterine gestational sac: Single Yolk sac:  Visualized. Embryo:  Visualized. Cardiac Activity: Not definitively Heart Rate:   bpm MSD: 15 mm   6 w   2 d CRL:  1.2 mm    w    d                  [place] EDC: Subchorionic hemorrhage:  None visualized. Maternal uterus/adnexae: There is a dominant cyst in the region of the right ovary measuring 4.6 x 3.6 x 2.6 cm. Immediately adjacent to the cyst is a rounded solid region measuring 2 x 2 x 2.2 cm which is centrally hyperechoic with blood flow. IMPRESSION: 1. There is a single live IUP. By last menstrual period, the gestational age is 7 weeks and 1 day. By mean sac diameter, the gestational age is 6 weeks and 2 days. By crown-rump length, the gestational age cannot be assessed due to its tiny size but is less than 5 weeks and 5 days. No definite cardiac activity seen within the tiny suspected fetal pole. Recommend close attention on follow-up to assess viability. 2. There is a dominant cyst in the right adnexa. Adjacent to this cyst is a rounded solid hypoechoic region measuring 2 x 2 x 2.2 cm, hyperechoic centrally. I suspect both the cystic and solid components are likely ovarian. An ectopic pregnancy would be unusual and unlikely given the visualized intrauterine pregnancy. Recommend attention on follow-up.  Electronically Signed   By: Gerome Sam III M.D   On: 02/23/2018 12:33    MAU COURSE CBC, Quant, ABO/Rh, ultrasound, wet prep and GC/chlamydia culture, UA  MDM - Pain and bleeding in early pregnancy with normal intrauterine  pregnancy and hemodynamically stable. Viability not confirmed, but may be due to early GA.  - Bleeding likely cervical, 2/2 BV. Will Tx w/ Flagyl.   ASSESSMENT 1. BV (bacterial vaginosis)   2. Vaginal bleeding affecting early pregnancy   3. Friable cervix     PLAN Discharge home in stable condition. Bleeding precautions Follow-up Information    OB/GYN of your choice Follow up.   Why:  Follow-up ultrasound and start prenatal care         Allergies as of 02/23/2018   No Known Allergies     Medication List    STOP taking these medications   HYDROcodone-acetaminophen 5-325 MG tablet Commonly known as:  NORCO/VICODIN   ibuprofen 600 MG tablet Commonly known as:  ADVIL,MOTRIN   ondansetron 4 MG tablet Commonly known as:  ZOFRAN     TAKE these medications   CONCEPT OB 130-92.4-1 MG Caps Take 1 tablet by mouth daily.   metroNIDAZOLE 500 MG tablet Commonly known as:  FLAGYL Take 1 tablet (500 mg total) by mouth 2 (two) times daily.        Katrinka Blazing, [place], CNM 02/23/2018  1:01 PM  4

## 2018-02-23 NOTE — MAU Note (Addendum)
Has been spotting for 2 days, some mild cramping, none today  LMP 01/04/2018  [facility]  Has confirmation paperwork with her in triage

## 2018-02-24 LAB — HIV ANTIBODY (ROUTINE TESTING W REFLEX): HIV Screen 4th Generation wRfx: NONREACTIVE

## 2018-02-24 LAB — GC/CHLAMYDIA PROBE AMP (~~LOC~~) NOT AT ARMC
CHLAMYDIA, DNA PROBE: NEGATIVE
NEISSERIA GONORRHEA: NEGATIVE

## 2018-02-25 ENCOUNTER — Encounter: Payer: Self-pay | Admitting: Obstetrics and Gynecology

## 2018-02-25 DIAGNOSIS — N83201 Unspecified ovarian cyst, right side: Secondary | ICD-10-CM | POA: Insufficient documentation

## 2018-03-04 ENCOUNTER — Telehealth: Payer: Self-pay | Admitting: *Deleted

## 2018-03-04 ENCOUNTER — Other Ambulatory Visit: Payer: Self-pay | Admitting: *Deleted

## 2018-03-04 DIAGNOSIS — Z3200 Encounter for pregnancy test, result unknown: Secondary | ICD-10-CM

## 2018-03-04 DIAGNOSIS — Z3400 Encounter for supervision of normal first pregnancy, unspecified trimester: Secondary | ICD-10-CM

## 2018-03-04 NOTE — Telephone Encounter (Signed)
Called pt to inform her of appointment for viability ultrasound on September 24th @ 0900.  Pt did not pick up the phone.  Left a voicemail for the pt informing her that we have an appointment we need to talk with her about and instructed her to call the office.

## 2018-03-04 NOTE — Telephone Encounter (Signed)
Called Pt to advise of upcoming appointment, no answer, VM Full.

## 2018-03-05 NOTE — Telephone Encounter (Signed)
Mychart message sent.

## 2018-03-11 ENCOUNTER — Ambulatory Visit (INDEPENDENT_AMBULATORY_CARE_PROVIDER_SITE_OTHER): Payer: Self-pay | Admitting: General Practice

## 2018-03-11 ENCOUNTER — Ambulatory Visit (HOSPITAL_COMMUNITY)
Admission: RE | Admit: 2018-03-11 | Discharge: 2018-03-11 | Disposition: A | Discharge status: Home / Self Care | Payer: Medicaid Other | Source: Ambulatory Visit | Attending: Obstetrics and Gynecology | Admitting: Obstetrics and Gynecology

## 2018-03-11 DIAGNOSIS — N83201 Unspecified ovarian cyst, right side: Secondary | ICD-10-CM | POA: Diagnosis not present

## 2018-03-11 DIAGNOSIS — O3481 Maternal care for other abnormalities of pelvic organs, first trimester: Secondary | ICD-10-CM | POA: Diagnosis not present

## 2018-03-11 DIAGNOSIS — Z712 Person consulting for explanation of examination or test findings: Secondary | ICD-10-CM

## 2018-03-11 DIAGNOSIS — Z3200 Encounter for pregnancy test, result unknown: Secondary | ICD-10-CM | POA: Diagnosis not present

## 2018-03-11 NOTE — Progress Notes (Signed)
Patient presents to office today for viability ultrasound results. Reviewed results with Luna KitchensKathryn Kooistra who finds living IUP, patient can begin prenatal care.  Informed patient of results, reviewed dating & provided pictures. Recommended she begin prenatal care. Patient verbalized understanding & had no questions.

## 2018-03-14 NOTE — Progress Notes (Signed)
Chart reviewed for nurse visit. Agree with plan of care.   Mccartney Brucks [place], CNM 03/14/2018 5:39 AM   

## 2018-03-23 ENCOUNTER — Inpatient Hospital Stay (HOSPITAL_COMMUNITY)
Admission: AD | Admit: 2018-03-23 | Discharge: 2018-03-23 | Discharge status: Against Medical Advice | Payer: Medicaid Other | Source: Ambulatory Visit | Attending: Obstetrics & Gynecology | Admitting: Obstetrics & Gynecology

## 2018-03-23 DIAGNOSIS — Z5329 Procedure and treatment not carried out because of patient's decision for other reasons: Secondary | ICD-10-CM | POA: Diagnosis present

## 2018-03-23 NOTE — MAU Note (Signed)
Cough for over a week, has not tried any medicine  No having some upper abdominal pain and swelling that is hurting when she coughs

## 2018-03-23 NOTE — MAU Note (Signed)
Left before being seen, not in lobby

## 2018-03-25 LAB — OB RESULTS CONSOLE RPR: RPR: NONREACTIVE

## 2018-03-25 LAB — OB RESULTS CONSOLE RUBELLA ANTIBODY, IGM: Rubella: IMMUNE

## 2018-03-25 LAB — OB RESULTS CONSOLE HEPATITIS B SURFACE ANTIGEN: Hepatitis B Surface Ag: NEGATIVE

## 2018-03-25 LAB — OB RESULTS CONSOLE HIV ANTIBODY (ROUTINE TESTING): HIV: NONREACTIVE

## 2018-03-25 LAB — OB RESULTS CONSOLE ANTIBODY SCREEN: Antibody Screen: NEGATIVE

## 2018-03-26 LAB — OB RESULTS CONSOLE GC/CHLAMYDIA
Chlamydia: NEGATIVE
Gonorrhea: NEGATIVE

## 2018-05-28 ENCOUNTER — Emergency Department (HOSPITAL_COMMUNITY)
Admission: EM | Admit: 2018-05-28 | Discharge: 2018-05-28 | Disposition: A | Discharge status: Home / Self Care | Payer: Medicaid Other | Attending: Emergency Medicine | Admitting: Emergency Medicine

## 2018-05-28 ENCOUNTER — Other Ambulatory Visit: Payer: Self-pay

## 2018-05-28 ENCOUNTER — Encounter (HOSPITAL_COMMUNITY): Payer: Self-pay

## 2018-05-28 DIAGNOSIS — Z79899 Other long term (current) drug therapy: Secondary | ICD-10-CM | POA: Insufficient documentation

## 2018-05-28 DIAGNOSIS — K219 Gastro-esophageal reflux disease without esophagitis: Secondary | ICD-10-CM

## 2018-05-28 DIAGNOSIS — R0789 Other chest pain: Secondary | ICD-10-CM | POA: Diagnosis present

## 2018-05-28 DIAGNOSIS — Z3492 Encounter for supervision of normal pregnancy, unspecified, second trimester: Secondary | ICD-10-CM

## 2018-05-28 DIAGNOSIS — O99612 Diseases of the digestive system complicating pregnancy, second trimester: Secondary | ICD-10-CM | POA: Insufficient documentation

## 2018-05-28 DIAGNOSIS — Z3A18 18 weeks gestation of pregnancy: Secondary | ICD-10-CM | POA: Insufficient documentation

## 2018-05-28 MED ORDER — PANTOPRAZOLE SODIUM 20 MG PO TBEC: 20.0000 mg | DELAYED_RELEASE_TABLET | Freq: Every day | ORAL | 0 refills | Status: DC
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
## 2018-05-28 NOTE — ED Triage Notes (Signed)
Pt reports central cp that radiates to her left breast that started last night. Pt [redacted] weeks pregnant. Pt c.o some intermittent SOB but denies any other symptoms.

## 2018-05-28 NOTE — Discharge Instructions (Addendum)
Follow-up with your obstetrician.  Watch for fevers or more difficulty breathing.

## 2018-06-18 NOTE — L&D Delivery Note (Signed)
Delivery Note At  a viable female was delivered via  (Presentation:OA ;  ).  APGAR:8 ,8 ; weight pending  .   Placenta status:complete , . 3V Cord:  with the following complications:short cord .  Anesthesia:  Epidural Episiotomy:  None Lacerations: 1st  periureteral and vaginal 1st Suture Repair: 2.0 vicryl, 4 .0 vicryl Est. Blood Loss (mL):  726  Mom to postpartum.  Baby to NICU.  Henderson Newcomer Prothero 10/19/2018, 2:49 AM

## 2018-10-18 ENCOUNTER — Encounter (HOSPITAL_COMMUNITY): Payer: Self-pay

## 2018-10-18 ENCOUNTER — Inpatient Hospital Stay (HOSPITAL_COMMUNITY): Payer: Medicaid Other | Admitting: Anesthesiology

## 2018-10-18 ENCOUNTER — Other Ambulatory Visit: Payer: Self-pay

## 2018-10-18 ENCOUNTER — Inpatient Hospital Stay (HOSPITAL_COMMUNITY)
Admission: AD | Admit: 2018-10-18 | Discharge: 2018-10-21 | DRG: 807 | Disposition: A | Discharge status: Home / Self Care | Payer: Medicaid Other | Attending: Obstetrics and Gynecology | Admitting: Obstetrics and Gynecology

## 2018-10-18 DIAGNOSIS — O4292 Full-term premature rupture of membranes, unspecified as to length of time between rupture and onset of labor: Principal | ICD-10-CM

## 2018-10-18 DIAGNOSIS — O43123 Velamentous insertion of umbilical cord, third trimester: Secondary | ICD-10-CM | POA: Diagnosis present

## 2018-10-18 DIAGNOSIS — O134 Gestational [pregnancy-induced] hypertension without significant proteinuria, complicating childbirth: Secondary | ICD-10-CM | POA: Diagnosis present

## 2018-10-18 DIAGNOSIS — Z3A39 39 weeks gestation of pregnancy: Secondary | ICD-10-CM

## 2018-10-18 DIAGNOSIS — O139 Gestational [pregnancy-induced] hypertension without significant proteinuria, unspecified trimester: Secondary | ICD-10-CM | POA: Diagnosis present

## 2018-10-18 DIAGNOSIS — O4290 Premature rupture of membranes, unspecified as to length of time between rupture and onset of labor, unspecified weeks of gestation: Secondary | ICD-10-CM | POA: Diagnosis present

## 2018-10-18 DIAGNOSIS — Z3689 Encounter for other specified antenatal screening: Secondary | ICD-10-CM

## 2018-10-18 HISTORY — DX: Other specified health status: Z78.9

## 2018-10-18 LAB — URINALYSIS, ROUTINE W REFLEX MICROSCOPIC
Bilirubin Urine: NEGATIVE
Glucose, UA: NEGATIVE mg/dL
Hgb urine dipstick: NEGATIVE
Ketones, ur: NEGATIVE mg/dL
Leukocytes,Ua: NEGATIVE
Nitrite: NEGATIVE
Protein, ur: NEGATIVE mg/dL
Specific Gravity, Urine: 1.015 (ref 1.005–1.030)
pH: 6 (ref 5.0–8.0)

## 2018-10-18 LAB — CBC
HCT: 35 % — ABNORMAL LOW (ref 36.0–46.0)
Hemoglobin: 11.4 g/dL — ABNORMAL LOW (ref 12.0–15.0)
MCH: 26 pg (ref 26.0–34.0)
MCHC: 32.6 g/dL (ref 30.0–36.0)
MCV: 79.9 fL — ABNORMAL LOW (ref 80.0–100.0)
Platelets: 252 10*3/uL (ref 150–400)
RBC: 4.38 MIL/uL (ref 3.87–5.11)
RDW: 13.8 % (ref 11.5–15.5)
WBC: 8.3 10*3/uL (ref 4.0–10.5)
nRBC: 0 % (ref 0.0–0.2)

## 2018-10-18 LAB — COMPREHENSIVE METABOLIC PANEL
ALT: 27 U/L (ref 0–44)
AST: 31 U/L (ref 15–41)
Albumin: 3 g/dL — ABNORMAL LOW (ref 3.5–5.0)
Alkaline Phosphatase: 116 U/L (ref 38–126)
Anion gap: 11 (ref 5–15)
BUN: 7 mg/dL (ref 6–20)
CO2: 19 mmol/L — ABNORMAL LOW (ref 22–32)
Calcium: 8.8 mg/dL — ABNORMAL LOW (ref 8.9–10.3)
Chloride: 106 mmol/L (ref 98–111)
Creatinine, Ser: 0.64 mg/dL (ref 0.44–1.00)
GFR calc Af Amer: >60 mL/min (ref >60)
GFR calc non Af Amer: >60 mL/min (ref >60)
Glucose, Bld: 80 mg/dL (ref 70–99)
Potassium: 3.6 mmol/L (ref 3.5–5.1)
Sodium: 136 mmol/L (ref 135–145)
Total Bilirubin: 0.7 mg/dL (ref 0.3–1.2)
Total Protein: 6.7 g/dL (ref 6.5–8.1)

## 2018-10-18 LAB — PROTEIN / CREATININE RATIO, URINE
Creatinine, Urine: 110.97 mg/dL
Protein Creatinine Ratio: 0.13 mg/mg{Cre} (ref 0.00–0.15)
Total Protein, Urine: 14 mg/dL

## 2018-10-18 LAB — AMNISURE RUPTURE OF MEMBRANE (ROM) NOT AT ARMC: Amnisure ROM: POSITIVE

## 2018-10-18 LAB — TYPE AND SCREEN
ABO/RH(D): O POS
Antibody Screen: NEGATIVE

## 2018-10-18 LAB — ABO/RH: ABO/RH(D): O POS

## 2018-10-18 MED ORDER — SODIUM CHLORIDE (PF) 0.9 % IJ SOLN
INTRAMUSCULAR | Status: DC | PRN
  Administered 2018-10-18: 14 mL/h via EPIDURAL

## 2018-10-18 MED ORDER — OXYTOCIN BOLUS FROM INFUSION
500.0000 mL | Freq: Once | INTRAVENOUS | Status: AC
  Administered 2018-10-19: 500 mL via INTRAVENOUS

## 2018-10-18 MED ORDER — LIDOCAINE HCL (PF) 1 % IJ SOLN
INTRAMUSCULAR | Status: DC | PRN
  Administered 2018-10-18: 11 mL via EPIDURAL

## 2018-10-18 MED ORDER — ACETAMINOPHEN 325 MG PO TABS: 650.0000 mg | ORAL_TABLET | ORAL | Status: DC | PRN

## 2018-10-18 MED ORDER — OXYCODONE-ACETAMINOPHEN 5-325 MG PO TABS: 1.0000 | ORAL_TABLET | ORAL | Status: DC | PRN

## 2018-10-18 MED ORDER — PHENYLEPHRINE 40 MCG/ML (10ML) SYRINGE FOR IV PUSH (FOR BLOOD PRESSURE SUPPORT): 80.0000 ug | PREFILLED_SYRINGE | INTRAVENOUS | Status: DC | PRN

## 2018-10-18 MED ORDER — OXYCODONE-ACETAMINOPHEN 5-325 MG PO TABS: 2.0000 | ORAL_TABLET | ORAL | Status: DC | PRN

## 2018-10-18 MED ORDER — LACTATED RINGERS IV SOLN
500.0000 mL | Freq: Once | INTRAVENOUS | Status: AC
  Administered 2018-10-18: 18:00:00 500 mL via INTRAVENOUS

## 2018-10-18 MED ORDER — FENTANYL-BUPIVACAINE-NACL 0.5-0.125-0.9 MG/250ML-% EP SOLN: 12.0000 mL/h | EPIDURAL | Status: DC | PRN

## 2018-10-18 MED ORDER — SOD CITRATE-CITRIC ACID 500-334 MG/5ML PO SOLN: 30.0000 mL | ORAL | Status: DC | PRN

## 2018-10-18 MED ORDER — EPHEDRINE 5 MG/ML INJ: 10.0000 mg | INTRAVENOUS | Status: DC | PRN

## 2018-10-18 MED ORDER — LIDOCAINE HCL (PF) 1 % IJ SOLN: 30.0000 mL | INTRAMUSCULAR | Status: DC | PRN

## 2018-10-18 MED ORDER — LACTATED RINGERS IV SOLN
INTRAVENOUS | Status: DC
  Administered 2018-10-18 – 2018-10-19 (×4): via INTRAVENOUS

## 2018-10-18 MED ORDER — FENTANYL CITRATE (PF) 100 MCG/2ML IJ SOLN
50.0000 ug | INTRAMUSCULAR | Status: DC | PRN
  Administered 2018-10-18 (×2): 100 ug via INTRAVENOUS
  Filled 2018-10-18 (×2): qty 2

## 2018-10-18 MED ORDER — OXYTOCIN 40 UNITS IN NORMAL SALINE INFUSION - SIMPLE MED
2.5000 [IU]/h | INTRAVENOUS | Status: DC
  Administered 2018-10-19: 2.5 [IU]/h via INTRAVENOUS
  Filled 2018-10-18: qty 1000

## 2018-10-18 MED ORDER — DIPHENHYDRAMINE HCL 50 MG/ML IJ SOLN: 12.5000 mg | INTRAMUSCULAR | Status: DC | PRN

## 2018-10-18 MED ORDER — PHENYLEPHRINE 40 MCG/ML (10ML) SYRINGE FOR IV PUSH (FOR BLOOD PRESSURE SUPPORT)
PREFILLED_SYRINGE | INTRAVENOUS | Status: AC
  Filled 2018-10-18: qty 10

## 2018-10-18 MED ORDER — ONDANSETRON HCL 4 MG/2ML IJ SOLN: 4.0000 mg | Freq: Four times a day (QID) | INTRAMUSCULAR | Status: DC | PRN

## 2018-10-18 MED ORDER — FENTANYL-BUPIVACAINE-NACL 0.5-0.125-0.9 MG/250ML-% EP SOLN
EPIDURAL | Status: AC
  Filled 2018-10-18: qty 250

## 2018-10-18 MED ORDER — LACTATED RINGERS IV SOLN: 500.0000 mL | INTRAVENOUS | Status: DC | PRN

## 2018-10-18 NOTE — Progress Notes (Addendum)
Errin Clair is a 28 y.o. G1P0 at [redacted]w[redacted]d by  admitted for PROM in early labor.   Subjective: Patient reports contractions are getting stronger. Anesthesia eval indicates she is a candidate for epidural if she chooses but she presently desires natural birth. Amniotic membrane palpable on exam but no forebag present, suggests high leak. Preeclampsia bloodwork is unremarkable, urine PCR pending results.  Objective: Vitals:   10/18/18 1033 10/18/18 1116 10/18/18 1117 10/18/18 1158  BP: 123/82  126/85 (!) 146/91  Pulse: 81  82 83  Resp: 17 16  16   Temp:  (!) 97.5 F (36.4 C)    TempSrc:  Oral    SpO2:      Weight:      Height:        FHT:  FHR: 140s bpm, variability: moderate,  accelerations:  Present,  decelerations:  Absent UC:   irregular, every 4-6 minutes SVE:   Dilation: 4 Effacement (%): 100 Station: -2 Exam by:: Kathalene Frames CNM  Labs: Lab Results  Component Value Date   WBC 8.3 10/18/2018   HGB 11.4 (L) 10/18/2018   HCT 35.0 (L) 10/18/2018   MCV 79.9 (L) 10/18/2018   PLT 252 10/18/2018    Assessment / Plan: Spontaneous labor, progressing normally  Labor: Progressing normally Preeclampsia:  no signs or symptoms of toxicity, intake and ouput balanced and labs stable Fetal Wellbeing:  Category I Pain Control:  Labor support without medications I/D:  n/a Anticipated MOD:  NSVD  Janeece Riggers 10/18/2018, 12:28 PM

## 2018-10-18 NOTE — Anesthesia Procedure Notes (Signed)
Epidural Patient location during procedure: OB Start time: 10/18/2018 6:35 PM End time: 10/18/2018 6:47 PM  Staffing Anesthesiologist: Lowella Curb, MD Performed: anesthesiologist   Preanesthetic Checklist Completed: patient identified, site marked, surgical consent, pre-op evaluation, timeout performed, IV checked, risks and benefits discussed and monitors and equipment checked  Epidural Patient position: sitting Prep: ChloraPrep Patient monitoring: heart rate, cardiac monitor, continuous pulse ox and blood pressure Approach: midline Location: L2-L3 Injection technique: LOR saline  Needle:  Needle type: Tuohy  Needle gauge: 17 G Needle length: 9 cm Needle insertion depth: 6 cm Catheter type: closed end flexible Catheter size: 20 Guage Catheter at skin depth: 10 cm Test dose: negative  Assessment Events: blood not aspirated, injection not painful, no injection resistance, negative IV test and no paresthesia  Additional Notes Reason for block:procedure for pain

## 2018-10-18 NOTE — MAU Provider Note (Addendum)
Chief Complaint:  Contractions and Rupture of Membranes   First Provider Initiated Contact with Patient 10/18/18 [id number]      HPI: Ariel Cardenas is a 28 y.o. G1P0 at [redacted]w[redacted]d who presents to maternity admissions reporting cramping and contractions with onset this morning and leaking of clear fluid x 1 week. She reports cramping lower abdominal intermittent pain that is worsening since onset early this morning.  The pain radiates to her low back.  She reports discharge/leaking x 1 week that has required a pantyliner but not a pad.  She denies any hx of HTN prior to or during pregnancy.  There is good fetal movement. There are no other symptoms, no h/a, epigastric pain, or visual disturbances.    HPI  Past Medical History: Past Medical History:  Diagnosis Date  . Medical history non-contributory     Past obstetric history: OB History  Gravida Para Term Preterm AB Living  1            SAB TAB Ectopic Multiple Live Births               # Outcome Date GA Lbr Len/2nd Weight Sex Delivery Anes PTL Lv  1 Current             Past Surgical History: Past Surgical History:  Procedure Laterality Date  . BACK SURGERY      Family History: Family History  Problem Relation Age of Onset  . Allergies Mother   . Allergies Brother     Social History: Social History   Tobacco Use  . Smoking status: Never Smoker  . Smokeless tobacco: Never Used  Substance Use Topics  . Alcohol use: No  . Drug use: No    Allergies: No Known Allergies  Meds:  Medications Prior to Admission  Medication Sig Dispense Refill Last Dose  . metroNIDAZOLE (FLAGYL) 500 MG tablet Take 1 tablet (500 mg total) by mouth 2 (two) times daily. 14 tablet 0   . pantoprazole (PROTONIX) 20 MG tablet Take 1 tablet (20 mg total) by mouth daily. 30 tablet 0   . Prenat w/o A Vit-FeFum-FePo-FA (CONCEPT OB) 130-92.4-1 MG CAPS Take 1 tablet by mouth daily. 30 capsule 12     ROS:  Review of Systems  Constitutional: Negative for  chills, fatigue and fever.  Eyes: Negative for visual disturbance.  Respiratory: Negative for shortness of breath.   Cardiovascular: Negative for chest pain.  Gastrointestinal: Positive for abdominal pain. Negative for nausea and vomiting.  Genitourinary: Positive for vaginal discharge. Negative for difficulty urinating, dysuria, flank pain, pelvic pain, vaginal bleeding and vaginal pain.  Musculoskeletal: Positive for back pain.  Neurological: Negative for dizziness and headaches.  Psychiatric/Behavioral: Negative.      I have reviewed patient's Past Medical Hx, Surgical Hx, Family Hx, Social Hx, medications and allergies.   Physical Exam   Patient Vitals for the past 24 hrs:  BP Temp Temp src Pulse Resp SpO2 Height Weight  10/18/18 0830 129/88 - - 73 - 98 % - -  10/18/18 0815 128/83 - - 75 - 98 % - -  10/18/18 0800 (!) 139/94 - - 81 - 100 % - -  10/18/18 0733 (!) 132/95 - - 86 - - - -  10/18/18 0725 (!) 133/98 97.9 F (36.6 C) Oral 100 18 100 % 5\' 1"  (1.549 m) 80.7 kg  10/18/18 0720 (!) 133/98 - - 91 - - - -   Constitutional: Well-developed, well-nourished female in no acute distress.  Cardiovascular:  normal rate Respiratory: normal effort GI: Abd soft, non-tender, gravid appropriate for gestational age.  MS: Extremities nontender, no edema, normal ROM Neurologic: Alert and oriented x 4.  GU: Neg CVAT.  PELVIC EXAM: Cervix pink, visually open, slightly friable during exam, large amount yellow mucus discharge, vaginal walls and external genitalia normal  Dilation: 3 Effacement (%): 80 Presentation: Vertex Exam by:: Sharen CounterLisa Leftwich-Kirby CNM   FHT:  Baseline 135 , moderate variability, accelerations present, no decelerations Contractions: q 3-4 mins, mild to palpation   Labs: Results for orders placed or performed during the hospital encounter of 10/18/18 (from the past 24 hour(s))  Urinalysis, Routine w reflex microscopic     Status: Abnormal   Collection Time: 10/18/18   7:22 AM  Result Value Ref Range   Color, Urine YELLOW YELLOW   APPearance HAZY (A) CLEAR   Specific Gravity, Urine 1.015 1.005 - 1.030   pH 6.0 5.0 - 8.0   Glucose, UA NEGATIVE NEGATIVE mg/dL   Hgb urine dipstick NEGATIVE NEGATIVE   Bilirubin Urine NEGATIVE NEGATIVE   Ketones, ur NEGATIVE NEGATIVE mg/dL   Protein, ur NEGATIVE NEGATIVE mg/dL   Nitrite NEGATIVE NEGATIVE   Leukocytes,Ua NEGATIVE NEGATIVE  CBC     Status: Abnormal   Collection Time: 10/18/18  7:55 AM  Result Value Ref Range   WBC 8.3 4.0 - 10.5 K/uL   RBC 4.38 3.87 - 5.11 MIL/uL   Hemoglobin 11.4 (L) 12.0 - 15.0 g/dL   HCT 16.135.0 (L) 09.636.0 - 04.546.0 %   MCV 79.9 (L) 80.0 - 100.0 fL   MCH 26.0 26.0 - 34.0 pg   MCHC 32.6 30.0 - 36.0 g/dL   RDW 40.913.8 81.111.5 - 91.415.5 %   Platelets 252 150 - 400 K/uL   nRBC 0.0 0.0 - 0.2 %  Amnisure rupture of membrane (rom)not [facility]     Status: None   Collection Time: 10/18/18  8:05 AM  Result Value Ref Range   Amnisure ROM POSITIVE    --/--/O POS Performed at [facility], [address] [place], [place] [id number]  (09/08 1041)  Imaging:  No results found.  MAU Course/MDM: Orders Placed This Encounter  Procedures  . Urinalysis, Routine w reflex microscopic  . CBC  . Comprehensive metabolic panel  . Protein / creatinine ratio, urine  . Amnisure rupture of membrane (rom)not at [facility]  . Contraction - monitoring  . External fetal heart monitoring  . Vaginal exam  . Fern Test    No orders of the defined types were placed in this encounter.    NST reviewed and reactive SSE reveals discharge c/w mucus plug, no pooling of fluid.  Amnisure collected and pending.   Pt BP elevated without s/sx of PEC and with no hx HTN PEC labs pending Cervix 3/80/-2, vertex.  Plan to recheck cervix to evaluate for labor.  Report to [name], NP.   Sharen CounterLisa Leftwich-Kirby Certified Nurse-Midwife 10/18/2018 8:26 AM   UA: hazy, otherwise WNL Amnisure: POSITIVE CBC: H/H 11.4/35, WNL for  pregnancy CMP: pending at time of admission PCr: pending at time of admission  Since transfer of care, NST reviewed with the following findings: EFM: reactive       -baseline: 140       -variability: moderate       -accels: present, 15x15       -decels: single variable       -TOCO: mild ctx  Called Dr. Richardson Doppole @0845 , relayed information about +Amnisure/elevated BP/EFM tracing, per  Dr. Richardson Dopp OK to admit to L&D and to call Kathalene Frames, CNM for admission. Dr. Richardson Dopp also states that a catheterized PCr must be drawn as PCr via clean catch will not be valid with ROM. Called Kathalene Frames, CNM immediately after speaking to Dr. Richardson Dopp. Greer to enter admission orders and relayed information above from Dr. Richardson Dopp re: new PCr. Waynette Buttery reports she will see the patient once she is transferred to L&D floor. Care transferred to Kathalene Frames.  ASSESSMENT & PLAN  1. PROM via +Amnisure 2. 39 weeks 3. Reactive NST 4. Admit to L&D for delivery  Care transferred to Kathalene Frames, CNM.  Marylen Ponto, NP  8:55 AM 10/18/2018

## 2018-10-18 NOTE — H&P (Addendum)
Ariel Cardenas is a 28 y.o. female presenting for premature rupture of membranes and possible early labor. Per patient she began to feel fluid leaking intermittently one week ago. She had an increase in loss of fluid last night.   OB History    Gravida  1   Para      Term      Preterm      AB      Living        SAB      TAB      Ectopic      Multiple      Live Births             Past Medical History:  Diagnosis Date  . Medical history non-contributory    Past Surgical History:  Procedure Laterality Date  . BACK SURGERY     Family History: family history includes Allergies in her brother and mother. Social History:  reports that she has never smoked. She has never used smokeless tobacco. She reports that she does not drink alcohol or use drugs.     Maternal Diabetes: No Genetic Screening: Declined Maternal Ultrasounds/Referrals: Normal Fetal Ultrasounds or other Referrals:  Other: growth q4 weeks from 28 weeks for marginal insertion of cord  Maternal Substance Abuse:  No Significant Maternal Medications:  None Significant Maternal Lab Results:  None Other Comments: history of spinal fusion for scoliosis   Review of Systems  All other systems reviewed and are negative.    Maternal Medical History:  Reason for admission: Rupture of membranes and contractions.   Contractions: Onset was 6-12 hours ago.   Frequency: irregular.   Perceived severity is moderate.    Fetal activity: Perceived fetal activity is normal.   Last perceived fetal movement was within the past hour.    Prenatal Complications - Diabetes: none.     Dilation: 3 Effacement (%): 80 Exam by:: Lisa Leftwich-Kirby CNM    Vitals:   10/18/18 0845 10/18/18 0900 10/18/18 0915 10/18/18 0929  BP: 120/84 129/87 136/81 138/87  Pulse: 77 76 84 84  Resp:   18 17  Temp:   98.1 F (36.7 C)   TempSrc:   Oral   SpO2: 99%     Weight:      Height:        Maternal Exam:  Abdomen: Patient  reports no abdominal tenderness. Fundal height is Size=dates.   Estimated fetal weight is 7lbs.   Fetal presentation: vertex  Introitus: Normal vulva. Normal vagina.  Amniotic fluid character: clear.  Pelvis: adequate for delivery.   Cervix: Cervix evaluated by digital exam.     Fetal Exam Fetal Monitor Review: Mode: ultrasound.   Baseline rate: 130s-140s.  Variability: moderate (6-25 bpm).   Pattern: accelerations present and no decelerations.    Fetal State Assessment: Category I - tracings are normal.     Physical Exam  Nursing note and vitals reviewed. Constitutional: She is oriented to person, place, and time. She appears well-developed and well-nourished.  HENT:  Head: Normocephalic and atraumatic.  Neck: Normal range of motion.  Cardiovascular: Normal rate, regular rhythm and normal heart sounds.  Respiratory: Effort normal and breath sounds normal. No respiratory distress.  GI: There is no abdominal tenderness.  Genitourinary:    Vulva, vagina and uterus normal.   Musculoskeletal: Normal range of motion.  Neurological: She is alert and oriented to person, place, and time.  Skin: Skin is warm and dry.  Psychiatric: She has a normal  mood and affect. Her behavior is normal. Judgment and thought content normal.     Prenatal labs: ABO, Rh: --/--/O POS Performed at [facility], [address], [place], [place] [address]  [id number]/08 1041) Antibody: Negative (10/08 0000)Negative Rubella: Immune (10/08 0000)Immune RPR: Nonreactive (10/08 0000) NR HBsAg: Negative (10/08 0000)NR  HIV: Non-reactive (10/08 0000)  GBS:   Negative   Assessment/Plan: 28 y.o. G1P0 at 39w Reactive NST, category I FHTs PROM on Friday, contractions started this morning Initial elevated blood pressure in MAU, PIH labs ordered, in and out cath urine sample for PCR per Dr. Richardson Dopp Admit to L&D Expectant management with augmentation as needed    Janeece Riggers 10/18/2018, 9:57 AM

## 2018-10-18 NOTE — Anesthesia Preprocedure Evaluation (Signed)

## 2018-10-18 NOTE — Progress Notes (Signed)
Subjective: Pt comfortable after epidural.    Objective: BP 122/87   Pulse 97   Temp 98.2 F (36.8 C) (Oral)   Resp 14   Ht 5\' 1"  (1.549 m)   Wt 80.7 kg   LMP 01/04/2018 (Exact Date)   SpO2 100%   BMI 33.63 kg/m  No intake/output data recorded. No intake/output data recorded.  FHT: Category 1 FHT 135 accels, no decels, variability present UC:   regular, every 3-5 minutes SVE:   Dilation: 5.5 Effacement (%): 100 Station: -2 Exam by:: Verdie Drown, RN Pitocin at None AROM of fore bag clear fluid Assessment:  G1P0 at 39 week IUP in active labor Cat 1 Plan: Monitor progress Anticipate SVD  Kenney Houseman CNM, MSN 10/18/2018, 7:22 PM

## 2018-10-18 NOTE — Progress Notes (Signed)
Ariel Cardenas is a 28 y.o. G1P0 at [redacted]w[redacted]d by  admitted for PROM in early labor.   Subjective: Patient reports contractions are getting stronger and is considering epidural. Urine PCR negative for preeclampsia. Fetal station remains high and fetal head isn't well-approximated to cervix. Encouraged movement and upright position as patient has been supine in bed since admission.   Objective: Vitals:   10/18/18 1359 10/18/18 1457 10/18/18 1601 10/18/18 1622  BP: (!) 146/88 129/88 (!) 139/95 124/75  Pulse: 79 88 87 81  Resp: 16 16 16 18   Temp: 98 F (36.7 C)  98 F (36.7 C)   TempSrc: Oral  Oral   SpO2:      Weight:      Height:        FHT:  FHR: 140s bpm, variability: moderate,  accelerations:  Present,  decelerations:  Absent UC:   irregular, every 3-5 minutes SVE:   Dilation: 5.5 Effacement (%): 100 Station: -2 Exam by:: [employer]  Labs: Lab Results  Component Value Date   WBC 8.3 10/18/2018   HGB 11.4 (L) 10/18/2018   HCT 35.0 (L) 10/18/2018   MCV 79.9 (L) 10/18/2018   PLT 252 10/18/2018   Assessment / Plan: Spontaneous labor, progressing normally  Labor: Progressing normally Preeclampsia:  no signs or symptoms of toxicity, intake and ouput balanced and labs stable Fetal Wellbeing:  Category I Pain Control:  IV pain meds I/D:  n/a Anticipated MOD:  NSVD  Janeece Riggers 10/18/2018, 4:27 PM

## 2018-10-18 NOTE — MAU Note (Signed)
Pt reports ctx q3-84min since 0200 this am.  Pt reports some int. LOF since last Friday(04/28).  Pt reports good fetal movement.

## 2018-10-19 LAB — CBC
HCT: 28.3 % — ABNORMAL LOW (ref 36.0–46.0)
Hemoglobin: 9.1 g/dL — ABNORMAL LOW (ref 12.0–15.0)
MCH: 25.9 pg — ABNORMAL LOW (ref 26.0–34.0)
MCHC: 32.2 g/dL (ref 30.0–36.0)
MCV: 80.6 fL (ref 80.0–100.0)
Platelets: 218 10*3/uL (ref 150–400)
RBC: 3.51 MIL/uL — ABNORMAL LOW (ref 3.87–5.11)
RDW: 14 % (ref 11.5–15.5)
WBC: 13.9 10*3/uL — ABNORMAL HIGH (ref 4.0–10.5)
nRBC: 0 % (ref 0.0–0.2)

## 2018-10-19 LAB — RPR: RPR Ser Ql: NONREACTIVE

## 2018-10-19 MED ORDER — IBUPROFEN 600 MG PO TABS
600.0000 mg | ORAL_TABLET | Freq: Four times a day (QID) | ORAL | Status: DC
  Administered 2018-10-19 – 2018-10-21 (×7): 600 mg via ORAL
  Filled 2018-10-19 (×9): qty 1

## 2018-10-19 MED ORDER — ZOLPIDEM TARTRATE 5 MG PO TABS: 5.0000 mg | ORAL_TABLET | Freq: Every evening | ORAL | Status: DC | PRN

## 2018-10-19 MED ORDER — DIBUCAINE (PERIANAL) 1 % EX OINT: 1.0000 "application " | TOPICAL_OINTMENT | CUTANEOUS | Status: DC | PRN

## 2018-10-19 MED ORDER — ONDANSETRON HCL 4 MG PO TABS: 4.0000 mg | ORAL_TABLET | ORAL | Status: DC | PRN

## 2018-10-19 MED ORDER — ONDANSETRON HCL 4 MG/2ML IJ SOLN: 4.0000 mg | INTRAMUSCULAR | Status: DC | PRN

## 2018-10-19 MED ORDER — COCONUT OIL OIL: 1.0000 "application " | TOPICAL_OIL | Status: DC | PRN

## 2018-10-19 MED ORDER — BENZOCAINE-MENTHOL 20-0.5 % EX AERO
1.0000 "application " | INHALATION_SPRAY | CUTANEOUS | Status: DC | PRN
  Filled 2018-10-19: qty 56

## 2018-10-19 MED ORDER — SIMETHICONE 80 MG PO CHEW: 80.0000 mg | CHEWABLE_TABLET | ORAL | Status: DC | PRN

## 2018-10-19 MED ORDER — FERROUS SULFATE 325 (65 FE) MG PO TABS
325.0000 mg | ORAL_TABLET | Freq: Two times a day (BID) | ORAL | Status: DC
  Administered 2018-10-19 – 2018-10-21 (×4): 325 mg via ORAL
  Filled 2018-10-19 (×4): qty 1

## 2018-10-19 MED ORDER — ACETAMINOPHEN 325 MG PO TABS: 650.0000 mg | ORAL_TABLET | ORAL | Status: DC | PRN

## 2018-10-19 MED ORDER — PRENATAL MULTIVITAMIN CH
1.0000 | ORAL_TABLET | Freq: Every day | ORAL | Status: DC
  Administered 2018-10-19 – 2018-10-20 (×2): 1 via ORAL
  Filled 2018-10-19 (×2): qty 1

## 2018-10-19 MED ORDER — SENNOSIDES-DOCUSATE SODIUM 8.6-50 MG PO TABS
2.0000 | ORAL_TABLET | ORAL | Status: DC
  Administered 2018-10-20: 2 via ORAL
  Filled 2018-10-19 (×2): qty 2

## 2018-10-19 MED ORDER — DIPHENHYDRAMINE HCL 25 MG PO CAPS: 25.0000 mg | ORAL_CAPSULE | Freq: Four times a day (QID) | ORAL | Status: DC | PRN

## 2018-10-19 MED ORDER — TETANUS-DIPHTH-ACELL PERTUSSIS 5-2.5-18.5 LF-MCG/0.5 IM SUSP: 0.5000 mL | Freq: Once | INTRAMUSCULAR | Status: DC

## 2018-10-19 MED ORDER — WITCH HAZEL-GLYCERIN EX PADS: 1.0000 "application " | MEDICATED_PAD | CUTANEOUS | Status: DC | PRN

## 2018-10-19 NOTE — Anesthesia Postprocedure Evaluation (Signed)
Anesthesia Post Note  Patient: Ariel Cardenas  Procedure(s) Performed: AN AD HOC LABOR EPIDURAL     Patient location during evaluation: Mother Baby Anesthesia Type: Epidural Level of consciousness: awake and alert, oriented and patient cooperative Pain management: pain level controlled Vital Signs Assessment: post-procedure vital signs reviewed and stable Respiratory status: spontaneous breathing Cardiovascular status: stable Postop Assessment: no headache, epidural receding, patient able to bend at knees and no signs of nausea or vomiting Anesthetic complications: no Comments: Pt. States pain score is 3.    Last Vitals:  Vitals:   10/19/18 0440 10/19/18 0550  BP: 123/84 120/89  Pulse: (!) 102 98  Resp: 20 18  Temp: 36.9 C 37.3 C  SpO2: 100% 100%    Last Pain:  Vitals:   10/19/18 0550  TempSrc: Oral  PainSc: 0-No pain   Pain Goal: Patients Stated Pain Goal: 0 (10/18/18 0727)                 Merrilyn Puma

## 2018-10-19 NOTE — Lactation Note (Signed)
This note was copied from a baby's chart. Lactation Consultation Note Baby 4 hrs old. Transported to NICU. LC set up DEBP. Mom shown how to use DEBP & how to disassemble, clean, & reassemble parts. Mom knows to pump q3h for 15-20 min.  Mom has everted nipples.  Hand expression w/glistening of colostrum taught. Mom demonstrated. Newborn behavior, feeding habits, STS, I&O, supply and demand. Encouraged mom to call for questions or concerns. Encouraged to call when baby is transferred to mom for Lactation to assist in latching.  Encouraged to call for assistance. Lactation brochure left at bedside.  Patient Name: Ariel Cardenas Today's Date: 10/19/2018 Reason for consult: Initial assessment;1st time breastfeeding;NICU baby;Term   Maternal Data Has patient been taught Hand Expression?: Yes Does the patient have breastfeeding experience prior to this delivery?: No  Feeding Feeding Type: Formula Nipple Type: Slow - flow  LATCH Score       Type of Nipple: Everted at rest and after stimulation           Interventions Interventions: Breast feeding basics reviewed;Breast compression;DEBP;Breast massage;Position options;Hand express  Lactation Tools Discussed/Used WIC Program: No Pump Review: Setup, frequency, and cleaning;Milk Storage Initiated by:: Peri Jefferson RN IBCLC Date initiated:: 10/19/18   Consult Status Consult Status: Follow-up Date: 10/19/18(when can BF baby) Follow-up type: In-patient    Hannia Matchett, Diamond Nickel 10/19/2018, 6:30 AM

## 2018-10-19 NOTE — Progress Notes (Signed)
Subjective: Pt is comfortable.  Denies rectal pressure  Objective: BP 124/81   Pulse 91   Temp 99 F (37.2 C)   Resp 14   Ht 5\' 1"  (1.549 m)   Wt 80.7 kg   LMP 01/04/2018 (Exact Date)   SpO2 100%   BMI 33.63 kg/m  No intake/output data recorded. Total I/O In: -  Out: 900 [Urine:900]  FHT: Category 1 FHT 1240 accels no decels variability present UC:   regular, every 3 minutes SVE:   Dilation: 10 Effacement (%): 100 Station: Plus 1 Exam by:: N Lugene Hitt CNM  Assessment:  Pt is a G1P0 at 39.1 IUP in active labor Cat 1 strip   Plan: Labor down till urge to push Anticipate SVD  Kenney Houseman CNM, MSN 10/19/2018, 12:22 AM

## 2018-10-19 NOTE — Lactation Note (Signed)
This note was copied from a baby's chart. Lactation Consultation Note  Patient Name: Girl Maycel Heagle Today's Date: 10/19/2018 Reason for consult: Follow-up assessment  1125 - 1139 - I visited Ms. Keicher to briefly check on her since baby Denny Peon has returned from NICU. Dad was about to feed baby a bottle of formula. Baby appeared sleepy and not interested. Dad states that baby received a bottle at 0800 and then approximately 20 mls again at 0930. I instructed dad on cue based feeding and typical formula feeding amounts on day one.  Mom is interested in latching baby and plans to call when ready for support. Mom has a pump at the bedside and states that she has pumped one time. I instructed her to pump both breasts now for 15 minutes on the initiation setting and again every three hours. Mom has supplies and I reviewed pumping volume expectations for days 1-3 and how to clean pump equipment.   I will return at a later point today to assist further. I recommended rest today.  Maternal Data Formula Feeding for Exclusion: No Has patient been taught Hand Expression?: Yes Does the patient have breastfeeding experience prior to this delivery?: No  Feeding Feeding Type: Formula Nipple Type: Slow - flow   Interventions Interventions: (formula feeding volumes; cues; pumping instructions)  Lactation Tools Discussed/Used Pump Review: Setup, frequency, and cleaning Initiated by:: HL Date initiated:: 10/19/18   Consult Status Consult Status: Follow-up Date: 10/19/18 Follow-up type: In-patient    Walker Shadow 10/19/2018, 11:42 AM

## 2018-10-20 NOTE — Lactation Note (Signed)
This note was copied from a baby's chart. Lactation Consultation Note  Patient Name: Ariel Cardenas Today's Date: 10/20/2018 Reason for consult: Follow-up assessment Baby is 84 hours old.  Mom's choice for feeding on admission was breast and formula.  Baby went to the NICU for a short time initially.  Mom just finished feeding baby formula.  She states she has not attempted putting baby to breast but would like to.  Instructed to watch for feeding cues and call for [facility] assist.  Mom agreeable.  Maternal Data    Feeding    LATCH Score                   Interventions    Lactation Tools Discussed/Used     Consult Status Consult Status: Follow-up Date: 10/21/18 Follow-up type: In-patient    [name] 10/20/2018, 10:38 AM

## 2018-10-20 NOTE — Progress Notes (Signed)
Subjective: Postpartum Day 1: Vaginal delivery, 1st degree periurtheral laceration Patient up ad lib, reports no syncope or dizziness. Feeding:  Bottle/Breast Contraceptive plan:  undecided  Objective: Vital signs in last 24 hours: Temp:  [97.4 F (36.3 C)-98.8 F (37.1 C)] 98 F (36.7 C) (05/04 0500) Pulse Rate:  [78-99] 78 (05/04 0500) Resp:  [16-18] 16 (05/04 0500) BP: (106-125)/(72-98) 106/72 (05/04 0500)  Physical Exam:  General: alert, cooperative and no distress Lochia: appropriate Uterine Fundus: firm Perineum: healing well DVT Evaluation: No evidence of DVT seen on physical exam.   CBC Latest Ref Rng & Units 10/19/2018 10/18/2018 02/23/2018  WBC 4.0 - 10.5 K/uL 13.9(H) 8.3 6.5  Hemoglobin 12.0 - 15.0 g/dL 9.4(H) 11.4(L) 12.0  Hematocrit 36.0 - 46.0 % 28.3(L) 35.0(L) 36.4  Platelets 150 - 400 K/uL 218 252 272     Assessment/Plan: Status post vaginal delivery day 1. Stable Continue current care. Discharge home and Lactation consult    Henderson Newcomer ProtheroCNM 10/20/2018, 7:14 AM

## 2018-10-21 ENCOUNTER — Encounter (HOSPITAL_COMMUNITY): Payer: Self-pay | Admitting: *Deleted

## 2018-10-21 MED ORDER — FERROUS SULFATE 325 (65 FE) MG PO TABS: 325.0000 mg | ORAL_TABLET | Freq: Two times a day (BID) | ORAL | 3 refills | Status: DC

## 2018-10-21 MED ORDER — IBUPROFEN 600 MG PO TABS: 600.0000 mg | ORAL_TABLET | Freq: Four times a day (QID) | ORAL | 0 refills | Status: DC

## 2018-10-21 NOTE — Discharge Summary (Signed)
OB Discharge Summary     Patient Name: Ariel Cardenas DOB: 05/30/1991 MRN: [id number]  Date of admission: 10/18/2018 Delivering MD: Kenney HousemanPROTHERO, NANCY JEAN   Date of discharge: 10/21/2018  Admitting diagnosis: 39wks, contractions Intrauterine pregnancy: 2333w3d     Secondary diagnosis:  Active Problems:   PROM (premature rupture of membranes)   Gestational hypertension   SVD (spontaneous vaginal delivery)  Additional problems: None     Discharge diagnosis: Term Pregnancy Delivered                                                                                                Post partum procedures:None  Augmentation: AROM pitocin  Complications: None  Hospital course:  Onset of Labor With Vaginal Delivery     28 y.o. yo G1P0 at 5933w3d was admitted in Latent Labor on 10/18/2018. Patient had an uncomplicated labor course as follows:  Membrane Rupture Time/Date: 10:00 PM ,10/17/2018   Intrapartum Procedures: Episiotomy: None [1]                                         Lacerations:  1st degree [2];Periurethral [8]  Patient had a delivery of a Viable infant. 10/19/2018  Information for the patient's newborn:  Deason, Girl Renee [[id number]][[id number]]  Delivery Method: Vag-Spont(Filed from delivery )    Pateint had an uncomplicated postpartum course.  She is ambulating, tolerating a regular diet, passing flatus, and urinating well. Patient is discharged home in stable condition on 10/21/18.   Physical exam  Vitals:   10/20/18 0500 10/20/18 1445 10/20/18 2141 10/21/18 0550  BP: 106/72 110/70 111/72 124/72  Pulse: 78 68 70 87  Resp: 16 15 14 16   Temp: 98 F (36.7 C) 98.2 F (36.8 C) 98.4 F (36.9 C) 97.9 F (36.6 C)  TempSrc: Oral Oral  Oral  SpO2:   98% 100%  Weight:      Height:       General: alert, cooperative and no distress Lochia: appropriate Uterine Fundus: firm Incision: N/A DVT Evaluation: No evidence of DVT seen on physical exam. Labs: Lab Results  Component Value Date   WBC 13.9  (H) 10/19/2018   HGB 9.1 (L) 10/19/2018   HCT 28.3 (L) 10/19/2018   MCV 80.6 10/19/2018   PLT 218 10/19/2018   CMP Latest Ref Rng & Units 10/18/2018  Glucose 70 - 99 mg/dL 80  BUN 6 - 20 mg/dL 7  Creatinine 0.860.44 - 5.781.00 mg/dL 4.690.64  Sodium 629135 - 528145 mmol/L 136  Potassium 3.5 - 5.1 mmol/L 3.6  Chloride 98 - 111 mmol/L 106  CO2 22 - 32 mmol/L 19(L)  Calcium 8.9 - 10.3 mg/dL 4.1(L8.8(L)  Total Protein 6.5 - 8.1 g/dL 6.7  Total Bilirubin 0.3 - 1.2 mg/dL 0.7  Alkaline Phos 38 - 126 U/L 116  AST 15 - 41 U/L 31  ALT 0 - 44 U/L 27    Discharge instruction: per After Visit Summary and "Baby and Me Booklet".  After visit meds:  PNV, iron  and ibuptofen  Diet: routine diet  Activity: Advance as tolerated. Pelvic rest for 6 weeks.   Outpatient follow up:6 weeks Follow up Appt:No future appointments. Follow up Visit:No follow-ups on file.  Postpartum contraception: Progesterone only pills  Newborn Data: Live born female  Birth Weight: 6 lb 15.8 oz (3170 g) APGAR: 8, 8  Newborn Delivery   Birth date/time:  10/19/2018 02:10:00 Delivery type:  Vaginal, Spontaneous     Baby Feeding: Bottle and Breast Disposition:home with mother   10/21/2018 Kenney Houseman, CNM

## 2018-12-28 ENCOUNTER — Encounter (HOSPITAL_BASED_OUTPATIENT_CLINIC_OR_DEPARTMENT_OTHER): Payer: Self-pay | Admitting: Emergency Medicine

## 2018-12-28 ENCOUNTER — Other Ambulatory Visit: Payer: Self-pay

## 2018-12-28 ENCOUNTER — Emergency Department (HOSPITAL_BASED_OUTPATIENT_CLINIC_OR_DEPARTMENT_OTHER)
Admission: EM | Admit: 2018-12-28 | Discharge: 2018-12-28 | Disposition: A | Discharge status: Home / Self Care | Payer: Medicaid Other | Attending: Emergency Medicine | Admitting: Emergency Medicine

## 2018-12-28 DIAGNOSIS — Z79899 Other long term (current) drug therapy: Secondary | ICD-10-CM | POA: Diagnosis not present

## 2018-12-28 DIAGNOSIS — N61 Mastitis without abscess: Secondary | ICD-10-CM | POA: Insufficient documentation

## 2018-12-28 DIAGNOSIS — N644 Mastodynia: Secondary | ICD-10-CM | POA: Diagnosis present

## 2018-12-28 MED ORDER — FLUCONAZOLE 150 MG PO TABS: 150.0000 mg | ORAL_TABLET | Freq: Every day | ORAL | 0 refills | Status: AC

## 2018-12-28 MED ORDER — DICLOXACILLIN SODIUM 500 MG PO CAPS: 500.0000 mg | ORAL_CAPSULE | Freq: Four times a day (QID) | ORAL | 0 refills | Status: DC

## 2018-12-28 MED ORDER — DICLOXACILLIN SODIUM 500 MG PO CAPS: 500.0000 mg | ORAL_CAPSULE | Freq: Four times a day (QID) | ORAL | 0 refills | Status: AC

## 2018-12-28 MED ORDER — FLUCONAZOLE 150 MG PO TABS: 150.0000 mg | ORAL_TABLET | Freq: Every day | ORAL | 0 refills | Status: DC
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
## 2018-12-28 NOTE — ED Notes (Signed)
ED Provider at bedside. 

## 2018-12-28 NOTE — ED Triage Notes (Signed)
Pt is a nursing mother. Reports L breast pain and redness. She had a fever of 101 yesterday.

## 2020-03-14 ENCOUNTER — Other Ambulatory Visit: Payer: Self-pay

## 2020-03-14 ENCOUNTER — Ambulatory Visit (HOSPITAL_COMMUNITY)
Admission: EM | Admit: 2020-03-14 | Discharge: 2020-03-14 | Disposition: A | Discharge status: Home / Self Care | Payer: Medicaid Other | Attending: Family Medicine | Admitting: Family Medicine

## 2020-03-14 ENCOUNTER — Encounter (HOSPITAL_COMMUNITY): Payer: Self-pay

## 2020-03-14 DIAGNOSIS — R21 Rash and other nonspecific skin eruption: Secondary | ICD-10-CM | POA: Diagnosis not present

## 2020-03-14 MED ORDER — PERMETHRIN 5 % EX CREA: TOPICAL_CREAM | CUTANEOUS | 0 refills | Status: DC

## 2020-03-14 MED ORDER — CETIRIZINE HCL 10 MG PO CAPS: 10.0000 mg | ORAL_CAPSULE | Freq: Every day | ORAL | 0 refills | Status: DC

## 2020-03-14 MED ORDER — METHYLPREDNISOLONE SODIUM SUCC 125 MG IJ SOLR
INTRAMUSCULAR | Status: AC
  Filled 2020-03-14: qty 2

## 2020-03-14 MED ORDER — PREDNISONE 10 MG PO TABS: ORAL_TABLET | ORAL | 0 refills | Status: DC

## 2020-03-14 MED ORDER — METHYLPREDNISOLONE SODIUM SUCC 125 MG IJ SOLR
125.0000 mg | Freq: Once | INTRAMUSCULAR | Status: AC
  Administered 2020-03-14: 125 mg via INTRAMUSCULAR
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
## 2020-03-14 NOTE — Discharge Instructions (Signed)
We gave you an injection of steroids today, continue with prednisone taper over the next 6 days beginning with 6 tablets on day 1, decrease by 1 tablet each day until complete, begin tomorrow if itching subsided by injection alone  Continue antihistamines-Zyrtec/Claritin in the morning, Benadryl at nighttime  Permethrin Cream 5%: Thoroughly massage cream (30 g for average adult) from head to soles of feet; leave on for 8 to 14 hours before removing (shower or bath); for elderly patients, also apply on the hairline, neck, scalp, temple, and forehead; may repeat if living mites are observed 14 days after first treatment; one application is generally curative.  Follow-up if rash not improving or worsening

## 2020-03-14 NOTE — ED Triage Notes (Signed)
Pt states the hives started on her inner thighs and then went to her lower legs and her ams/hands/and armpits. Pt complains of itchy and burning, worse at night. Pt is aox4 and ambulatory.

## 2020-07-12 ENCOUNTER — Ambulatory Visit (INDEPENDENT_AMBULATORY_CARE_PROVIDER_SITE_OTHER): Payer: Medicaid Other | Admitting: Certified Nurse Midwife

## 2020-07-12 ENCOUNTER — Encounter: Payer: Self-pay | Admitting: Certified Nurse Midwife

## 2020-07-12 ENCOUNTER — Other Ambulatory Visit: Payer: Self-pay

## 2020-07-12 ENCOUNTER — Ambulatory Visit (INDEPENDENT_AMBULATORY_CARE_PROVIDER_SITE_OTHER): Payer: Medicaid Other

## 2020-07-12 VITALS — BP 124/79 | HR 86 | Wt 146.0 lb

## 2020-07-12 DIAGNOSIS — O3680X0 Pregnancy with inconclusive fetal viability, not applicable or unspecified: Secondary | ICD-10-CM

## 2020-07-12 DIAGNOSIS — Z3480 Encounter for supervision of other normal pregnancy, unspecified trimester: Secondary | ICD-10-CM | POA: Insufficient documentation

## 2020-07-12 DIAGNOSIS — Z3A00 Weeks of gestation of pregnancy not specified: Secondary | ICD-10-CM
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
## 2020-07-12 NOTE — Patient Instructions (Signed)
Ectopic Pregnancy  An ectopic pregnancy happens when a fertilized egg grows outside of the womb (uterus). Fertilized means that sperm entered the egg. The egg cannot stay alive outside of the womb. What are the causes? The most common cause is damage to a fallopian tube. This damage stops the egg from getting to the womb. Instead, the egg stays in the tube. Sometimes, an ectopic pregnancy happens in other parts of the body. What increases the risk?  Getting treatment before to help you have a baby.  A past pregnancy outside of the womb.  A past surgery to have your tubes tied.  Getting pregnant while using a device in the womb to avoid getting pregnant.  Taking birth control pills before the age of 69.  Smoking or drinking alcohol.  Having a mother who took a medicine called DES many years ago. What are the signs or symptoms? Common symptoms of this condition include:  Missing a menstrual period.  Feeling like you may vomit.  Tiredness.  Breast pain.  Other signs that you are pregnant. Other symptoms may include:  Pain during sex.  Bleeding from the vagina.  Belly pain.  A fast heartbeat, low blood pressure, and sweating.  Pain or extra pressure while pooping (having a bowel movement). If your tube tears or bursts:  You may have sudden and very bad pain in your belly.  You may feel dizzy, weak, or light-headed.  You may faint.  You may have pain in your shoulder or neck. A torn or burst tube is an emergency. It can be life-threatening. How is this treated? This condition may be treated with:  Medicine. This may be given if: ? The pregnancy is found early and you are not bleeding. ? The tube has not torn or burst.  Surgery. This may be done to: ? Take out the pregnancy tissue. ? Stop bleeding. ? Take out part or all of the tube. ? Take out the womb. This is rare.  Blood tests.  Careful watching. Follow these instructions at home: Medicines  Take  over-the-counter and prescription medicines only as told by your doctor.  If told, take steps to prevent problems with pooping (constipation). You may need to: ? Drink enough fluid to keep your pee (urine) pale yellow. ? Take medicines. You will be told what medicines to take. ? Eat foods that are high in fiber. These include beans, whole grains, and fresh fruits and vegetables. ? Limit foods that are high in fat and sugar. These include fried or sweet foods.  Ask your doctor if you should avoid driving or using machines while you are taking your medicine. General instructions  Rest or limit your activities, if told to do this.  Do not have sex for 6 weeks or as told by your doctor.  Do not put tampons, vaginal cleaning wash (douche), or other things in your vagina. Do not use these things for 6 weeks or until your doctor says it is safe to use them.  Do not lift anything that is heavier than 10 lb (4.5 kg), or the limit that you are told.  Return to your normal activities when your doctor says that it is safe.  Keep all follow-up visits. Contact a doctor if:  You have a fever or chills.  You feel like you may vomit and you vomit. Get help right away if:  Your pain gets worse or is not helped by medicine.  You feel dizzy or weak.  You feel  light-headed.  You faint.  You have sudden and very bad pain in your belly.  You have very bad pain in your shoulder or neck. Summary  An ectopic pregnancy happens when a fertilized egg grows outside the womb.  This is an emergency.  The most common cause is damage to one of the fallopian tubes.  This condition may be treated with medicine, surgery, blood tests, or careful watching. This information is not intended to replace advice given to you by your health care provider. Make sure you discuss any questions you have with your health care provider. Document Revised: 09/25/2019 Document Reviewed: 09/15/2019 Elsevier Patient  Education  2021 [employer] Inc.  

## 2020-07-12 NOTE — Progress Notes (Signed)
Last pap- 2020- normal Increased vaginal discharge, odor and itching.Bedside U/S shows IU gestational sac that is elongated and measures .57cmx3.50cm.  Pt denies any vaginal bleeding or pain.  She states that she is certain of her LMP.  V Rogers,CNM present at bedside.

## 2020-07-13 ENCOUNTER — Other Ambulatory Visit: Payer: Self-pay | Admitting: *Deleted

## 2020-07-13 DIAGNOSIS — O3680X0 Pregnancy with inconclusive fetal viability, not applicable or unspecified: Secondary | ICD-10-CM

## 2020-07-13 LAB — CBC
HCT: 35.4 % (ref 35.0–45.0)
Hemoglobin: 11.5 g/dL — ABNORMAL LOW (ref 11.7–15.5)
MCH: 26.6 pg — ABNORMAL LOW (ref 27.0–33.0)
MCHC: 32.5 g/dL (ref 32.0–36.0)
MCV: 81.9 fL (ref 80.0–100.0)
MPV: 9.9 fL (ref 7.5–12.5)
Platelets: 277 10*3/uL (ref 140–400)
RBC: 4.32 10*6/uL (ref 3.80–5.10)
RDW: 11.6 % (ref 11.0–15.0)
WBC: 5.3 10*3/uL (ref 3.8–10.8)

## 2020-07-13 LAB — COMPREHENSIVE METABOLIC PANEL
AG Ratio: 1.3 (calc) (ref 1.0–2.5)
ALT: 14 U/L (ref 6–29)
AST: 13 U/L (ref 10–30)
Albumin: 4.1 g/dL (ref 3.6–5.1)
Alkaline phosphatase (APISO): 36 U/L (ref 31–125)
BUN: 9 mg/dL (ref 7–25)
CO2: 25 mmol/L (ref 20–32)
Calcium: 9.2 mg/dL (ref 8.6–10.2)
Chloride: 105 mmol/L (ref 98–110)
Creat: 0.54 mg/dL (ref 0.50–1.10)
Globulin: 3.1 g/dL (calc) (ref 1.9–3.7)
Glucose, Bld: 78 mg/dL (ref 65–99)
Potassium: 3.9 mmol/L (ref 3.5–5.3)
Sodium: 137 mmol/L (ref 135–146)
Total Bilirubin: 0.3 mg/dL (ref 0.2–1.2)
Total Protein: 7.2 g/dL (ref 6.1–8.1)

## 2020-07-13 LAB — HCG, QUANTITATIVE, PREGNANCY: HCG, Total, QN: 7060 m[IU]/mL

## 2020-07-13 NOTE — Progress Notes (Signed)
Spoke with pt this morning regarding whether she had her stat labs drawn yesterday.  She stated that she thought it was just on Thursday.  I explained that we needed a baseline then repeat in 48 hours.  She will return to the lab today then again on Thursday.  V Rogers,CNM notified.

## 2020-07-14 ENCOUNTER — Other Ambulatory Visit: Payer: Medicaid Other

## 2020-07-15 LAB — HCG, QUANTITATIVE, PREGNANCY: HCG, Total, QN: 4994 m[IU]/mL

## 2020-07-18 ENCOUNTER — Telehealth: Payer: Self-pay | Admitting: Certified Nurse Midwife

## 2020-07-18 NOTE — Telephone Encounter (Signed)
Called patient to discussed lab results. No answer unable to leave voicemail due to voicemail box not being set up.   Sharyon Cable, CNM 07/18/20, 8:30 AM

## 2020-08-05 ENCOUNTER — Other Ambulatory Visit: Payer: Self-pay

## 2020-08-05 ENCOUNTER — Ambulatory Visit (INDEPENDENT_AMBULATORY_CARE_PROVIDER_SITE_OTHER): Payer: Medicaid Other | Admitting: Certified Nurse Midwife

## 2020-08-05 ENCOUNTER — Encounter: Payer: Self-pay | Admitting: Certified Nurse Midwife

## 2020-08-05 VITALS — BP 120/76 | HR 84 | Resp 16 | Ht 61.0 in | Wt 143.0 lb

## 2020-08-05 DIAGNOSIS — Z3009 Encounter for other general counseling and advice on contraception: Secondary | ICD-10-CM | POA: Diagnosis not present

## 2020-08-05 DIAGNOSIS — O039 Complete or unspecified spontaneous abortion without complication: Secondary | ICD-10-CM | POA: Diagnosis not present

## 2020-08-05 LAB — POCT URINE PREGNANCY: Preg Test, Ur: POSITIVE — AB

## 2020-08-05 NOTE — Progress Notes (Signed)
History:  Ms. Lithzy Bernard is a 30 y.o. G2P1001 who presents to clinic today for follow up from miscarriage. Patient reports that vaginal bleeding started occurring after appointment 3 weeks ago initially as spotting. Reports about of week ago last Wednesday patient started to have heavy vaginal bleeding like a period with clots. She is currently continuing to spot. Some mild abdominal cramping.    The following portions of the patient's history were reviewed and updated as appropriate: allergies, current medications, family history, past medical history, social history, past surgical history and problem list.  Review of Systems:  Review of Systems  Constitutional: Negative.   Respiratory: Negative.   Cardiovascular: Negative.   Gastrointestinal: Positive for abdominal pain.       Abdominal cramping   Genitourinary:       Vaginal bleeding  Neurological: Negative.   Psychiatric/Behavioral: Negative.       Objective:  Physical Exam BP 120/76   Pulse 84   Resp 16   Ht 5\' 1"  (1.549 m)   Wt 143 lb (64.9 kg)   LMP 08/03/2020   Breastfeeding No   BMI 27.02 kg/m  Physical Exam Vitals reviewed.  Constitutional:      Appearance: Normal appearance.  Cardiovascular:     Rate and Rhythm: Normal rate and regular rhythm.  Pulmonary:     Effort: Pulmonary effort is normal. No respiratory distress.     Breath sounds: Normal breath sounds. No wheezing.  Abdominal:     General: There is no distension.     Palpations: Abdomen is soft. There is no mass.     Tenderness: There is no abdominal tenderness. There is no guarding.  Musculoskeletal:     Right lower leg: No edema.     Left lower leg: No edema.  Skin:    General: Skin is warm and dry.  Neurological:     Mental Status: She is alert and oriented to person, place, and time.  Psychiatric:        Mood and Affect: Mood normal.        Behavior: Behavior normal.        Thought Content: Thought content normal.     Labs and  Imaging Results for orders placed or performed in visit on 08/05/20 (from the past 24 hour(s))  POCT urine pregnancy     Status: Abnormal   Collection Time: 08/05/20  9:07 AM  Result Value Ref Range   Preg Test, Ur Positive (A) Negative    Assessment & Plan:  1. Spontaneous miscarriage - Miscarriage diagnosed on 1/26  - HCG on 1/26 7,060 then dropped to 4,994 on 1/28  - UPT continues to be positive today, repeat HCG obtained - discussed with patient possibility of needing cytotec due to patient still having spotting, patient verbalizes understanding  - CBC - B-HCG Quant  2. Birth control counseling - Patient wants to be on birth control and does not want to try to get pregnant for another year- has been on Norlyda in the past and wants to be back on that contraceptive  - Discussed with patient contraceptive is progesterone only and encouraged to take daily at same time and use back up birth control if she misses a dose.  - will wait to see HCG results and need for cytotec prior to initiating birth control, patient verbalizes understanding    2/28, CNM 08/05/2020 9:03 AM

## 2020-08-06 LAB — CBC
HCT: 33.5 % — ABNORMAL LOW (ref 35.0–45.0)
Hemoglobin: 10.9 g/dL — ABNORMAL LOW (ref 11.7–15.5)
MCH: 26.9 pg — ABNORMAL LOW (ref 27.0–33.0)
MCHC: 32.5 g/dL (ref 32.0–36.0)
MCV: 82.7 fL (ref 80.0–100.0)
MPV: 10.6 fL (ref 7.5–12.5)
Platelets: 253 10*3/uL (ref 140–400)
RBC: 4.05 10*6/uL (ref 3.80–5.10)
RDW: 13 % (ref 11.0–15.0)
WBC: 6.3 10*3/uL (ref 3.8–10.8)

## 2020-08-06 LAB — HCG, QUANTITATIVE, PREGNANCY: HCG, Total, QN: 797 m[IU]/mL

## 2020-08-08 ENCOUNTER — Other Ambulatory Visit: Payer: Self-pay | Admitting: Certified Nurse Midwife

## 2020-08-08 ENCOUNTER — Telehealth: Payer: Self-pay | Admitting: Certified Nurse Midwife

## 2020-08-08 DIAGNOSIS — Z3009 Encounter for other general counseling and advice on contraception: Secondary | ICD-10-CM

## 2020-08-08 DIAGNOSIS — Z30011 Encounter for initial prescription of contraceptive pills: Secondary | ICD-10-CM

## 2020-08-08 MED ORDER — NORETHINDRONE 0.35 MG PO TABS: 1.0000 | ORAL_TABLET | Freq: Every day | ORAL | 3 refills | Status: DC

## 2020-08-08 MED ORDER — MISOPROSTOL 200 MCG PO TABS: ORAL_TABLET | ORAL | 0 refills | Status: DC

## 2020-08-08 NOTE — Addendum Note (Signed)
Addended by: Sharyon Cable on: 08/08/2020 04:31 PM   Modules accepted: Orders

## 2020-08-08 NOTE — Telephone Encounter (Signed)
Patient called to review lab work and discuss plan of care. Patient verified by two methods.   Discussed with patient that lab work continues to be elevated and the need for cytotec - Rx for cytotec sent to pharmacy of choice. Discussed with patient side effects of medication.  Educated that once bleeding stops around 2-3 days then patient can start birth control pills. Use back up birth control for 1 week after initiating birth control pills. Both medications have been sent to the pharmacy.   Patient denies any questions at this time.   Sharyon Cable, CNM 08/08/20, 4:37 PM

## 2020-08-09 ENCOUNTER — Emergency Department (HOSPITAL_BASED_OUTPATIENT_CLINIC_OR_DEPARTMENT_OTHER)
Admission: EM | Admit: 2020-08-09 | Discharge: 2020-08-10 | Payer: Medicaid Other | Attending: Emergency Medicine | Admitting: Emergency Medicine

## 2020-08-09 ENCOUNTER — Encounter (HOSPITAL_BASED_OUTPATIENT_CLINIC_OR_DEPARTMENT_OTHER): Payer: Self-pay

## 2020-08-09 ENCOUNTER — Other Ambulatory Visit: Payer: Self-pay

## 2020-08-09 ENCOUNTER — Emergency Department (HOSPITAL_BASED_OUTPATIENT_CLINIC_OR_DEPARTMENT_OTHER): Payer: Medicaid Other

## 2020-08-09 DIAGNOSIS — N938 Other specified abnormal uterine and vaginal bleeding: Secondary | ICD-10-CM | POA: Insufficient documentation

## 2020-08-09 DIAGNOSIS — O039 Complete or unspecified spontaneous abortion without complication: Secondary | ICD-10-CM | POA: Diagnosis present

## 2020-08-09 DIAGNOSIS — O4690 Antepartum hemorrhage, unspecified, unspecified trimester: Secondary | ICD-10-CM

## 2020-08-09 LAB — CBC WITH DIFFERENTIAL/PLATELET
Abs Immature Granulocytes: 0.1 10*3/uL — ABNORMAL HIGH (ref 0.00–0.07)
Abs Immature Granulocytes: 0.07 10*3/uL (ref 0.00–0.07)
Basophils Absolute: 0.1 10*3/uL (ref 0.0–0.1)
Basophils Absolute: 0 10*3/uL (ref 0.0–0.1)
Basophils Relative: 0 %
Basophils Relative: 0 %
Eosinophils Absolute: 0 10*3/uL (ref 0.0–0.5)
Eosinophils Absolute: 0 10*3/uL (ref 0.0–0.5)
Eosinophils Relative: 0 %
Eosinophils Relative: 0 %
HCT: 28.1 % — ABNORMAL LOW (ref 36.0–46.0)
HCT: 18 % — ABNORMAL LOW (ref 36.0–46.0)
Hemoglobin: 9.1 g/dL — ABNORMAL LOW (ref 12.0–15.0)
Hemoglobin: 6 g/dL — CL (ref 12.0–15.0)
Immature Granulocytes: 1 %
Immature Granulocytes: 1 %
Lymphocytes Relative: 17 %
Lymphocytes Relative: 14 %
Lymphs Abs: 2.3 10*3/uL (ref 0.7–4.0)
Lymphs Abs: 1.3 10*3/uL (ref 0.7–4.0)
MCH: 26.8 pg (ref 26.0–34.0)
MCH: 27.9 pg (ref 26.0–34.0)
MCHC: 32.4 g/dL (ref 30.0–36.0)
MCHC: 33.3 g/dL (ref 30.0–36.0)
MCV: 82.9 fL (ref 80.0–100.0)
MCV: 83.7 fL (ref 80.0–100.0)
Monocytes Absolute: 0.7 10*3/uL (ref 0.1–1.0)
Monocytes Absolute: 0.3 10*3/uL (ref 0.1–1.0)
Monocytes Relative: 5 %
Monocytes Relative: 3 %
Neutro Abs: 10.7 10*3/uL — ABNORMAL HIGH (ref 1.7–7.7)
Neutro Abs: 7.7 10*3/uL (ref 1.7–7.7)
Neutrophils Relative %: 77 %
Neutrophils Relative %: 82 %
Platelets: 301 10*3/uL (ref 150–400)
Platelets: 192 10*3/uL (ref 150–400)
RBC: 3.39 MIL/uL — ABNORMAL LOW (ref 3.87–5.11)
RBC: 2.15 MIL/uL — ABNORMAL LOW (ref 3.87–5.11)
RDW: 13.1 % (ref 11.5–15.5)
RDW: 13.2 % (ref 11.5–15.5)
WBC: 13.9 10*3/uL — ABNORMAL HIGH (ref 4.0–10.5)
WBC: 9.4 10*3/uL (ref 4.0–10.5)
nRBC: 0 % (ref 0.0–0.2)
nRBC: 0 % (ref 0.0–0.2)

## 2020-08-09 LAB — COMPREHENSIVE METABOLIC PANEL
ALT: 11 U/L (ref 0–44)
AST: 18 U/L (ref 15–41)
Albumin: 3.5 g/dL (ref 3.5–5.0)
Alkaline Phosphatase: 24 U/L — ABNORMAL LOW (ref 38–126)
Anion gap: 11 (ref 5–15)
BUN: 12 mg/dL (ref 6–20)
CO2: 17 mmol/L — ABNORMAL LOW (ref 22–32)
Calcium: 8.2 mg/dL — ABNORMAL LOW (ref 8.9–10.3)
Chloride: 103 mmol/L (ref 98–111)
Creatinine, Ser: 0.59 mg/dL (ref 0.44–1.00)
GFR, Estimated: >60 mL/min (ref >60)
Glucose, Bld: 126 mg/dL — ABNORMAL HIGH (ref 70–99)
Potassium: 3.6 mmol/L (ref 3.5–5.1)
Sodium: 131 mmol/L — ABNORMAL LOW (ref 135–145)
Total Bilirubin: 0.7 mg/dL (ref 0.3–1.2)
Total Protein: 6.3 g/dL — ABNORMAL LOW (ref 6.5–8.1)

## 2020-08-09 LAB — PREPARE RBC (CROSSMATCH)

## 2020-08-09 LAB — HCG, QUANTITATIVE, PREGNANCY: hCG, Beta Chain, Quant, S: 510 m[IU]/mL — ABNORMAL HIGH (ref <5)

## 2020-08-09 LAB — CBG MONITORING, ED: Glucose-Capillary: 94 mg/dL (ref 70–99)

## 2020-08-09 MED ORDER — SODIUM CHLORIDE 0.9 % IV BOLUS
1000.0000 mL | Freq: Once | INTRAVENOUS | Status: AC
  Administered 2020-08-09: 1000 mL via INTRAVENOUS

## 2020-08-09 MED ORDER — HYDROMORPHONE HCL 1 MG/ML IJ SOLN
1.0000 mg | Freq: Once | INTRAMUSCULAR | Status: AC
  Administered 2020-08-10: 1 mg via INTRAVENOUS

## 2020-08-09 MED ORDER — SODIUM CHLORIDE 0.9 % IV BOLUS: 1000.0000 mL | Freq: Once | INTRAVENOUS | Status: DC

## 2020-08-09 MED ORDER — SODIUM CHLORIDE 0.9 % IV SOLN: 10.0000 mL/h | Freq: Once | INTRAVENOUS | Status: DC

## 2020-08-09 MED ORDER — TRANEXAMIC ACID 1000 MG/10ML IV SOLN
INTRAVENOUS | Status: AC
  Administered 2020-08-09: 500 mg via TOPICAL
  Filled 2020-08-09: qty 10

## 2020-08-09 MED ORDER — TRANEXAMIC ACID 1000 MG/10ML IV SOLN: 500.0000 mg | Freq: Once | INTRAVENOUS | Status: AC

## 2020-08-09 MED ORDER — HYDROMORPHONE HCL 1 MG/ML IJ SOLN
INTRAMUSCULAR | Status: AC
  Filled 2020-08-09: qty 1

## 2020-08-09 NOTE — ED Notes (Signed)
Jan. 25th Pt. Found out she was going thru a miscarriage.  Pt. Then 3 wks later was seen in OB office and had blood work and was told she would get a call back and was told she would get a call back.  Pt. Did get call back and was told to get med at pharmacy and take to flush products of conception out.  Pt. Started bleeding spotting in Jan.  Pt. Did take the pill Misoprostil prescribed to her on today Tues. [phone] and began to bleed with lots of huge clotting.  Pt. Reports she still had a high HCG level on Friday per Her OB as of last Friday.

## 2020-08-09 NOTE — ED Notes (Signed)
Pt out of department with [facility].

## 2020-08-09 NOTE — ED Notes (Signed)
Critic hemoglobin 6.0 received from lab and relayed to Dr. Silverio Lay. Blood infusion ordered.

## 2020-08-09 NOTE — ED Triage Notes (Signed)
Pt states she has been having a miscarriage x3 weeks. States she had been spotting intermittently. Started having heavier bleeding this past Wednesday, saw OBGYN on Friday who prescribed her medicine, took it today now having heavier bleeding with clots. States feels lightheaded upon standing.

## 2020-08-09 NOTE — ED Notes (Signed)
Checked CBG 94, RN Misty informed

## 2020-08-09 NOTE — ED Notes (Signed)
ED Provider at bedside. 

## 2020-08-09 NOTE — ED Notes (Signed)
Guaze soaked with Tranexamic Acid placed in vaginal wall by Dr Silverio Lay.

## 2020-08-09 NOTE — ED Notes (Signed)
Over to [place]  In Radiology with Pt. And transported back.  Pt. In room 7.  EDP at bedside.  Pt. Alert.  Pt. Husband at bedside.  Pt. B/p taken..  Fluids started and more blood work done.  Pt. Is alert and oriented.

## 2020-08-09 NOTE — ED Notes (Signed)
Patient transported to Ultrasound 

## 2020-08-09 NOTE — ED Notes (Signed)
Pt. Alert and oriented.. Pt. Tolerated IV sticks and fluid boluses started as well as all things done for her for betterment of care.  Pt. In no distress.  Husband at bedside.

## 2020-08-09 NOTE — ED Notes (Signed)
RN called into room by EDP YAO to start IV and start fluids due to Pt. Condition of weakness and need due to vagal response to pelvic and bleeding.

## 2020-08-09 NOTE — ED Notes (Signed)
Pt. Reports she is having a miscarriage and was given meds to help her excrete the fetus.  Pt. Is bleeding heavy and feels shob with exertion and feels weak she reports.  Pt. Has been seen by EDP.

## 2020-08-09 NOTE — ED Notes (Addendum)
Blood infusion started at this time 163ml/hr. Unable to scan blood product. Blood double checked by Karle Barr
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
## 2020-08-10 ENCOUNTER — Encounter (HOSPITAL_COMMUNITY): Payer: Self-pay

## 2020-08-10 DIAGNOSIS — O039 Complete or unspecified spontaneous abortion without complication: Secondary | ICD-10-CM

## 2020-08-10 LAB — BLOOD GAS, VENOUS
Acid-base deficit: 6.8 mmol/L — ABNORMAL HIGH (ref 0.0–2.0)
Bicarbonate: 18.2 mmol/L — ABNORMAL LOW (ref 20.0–28.0)
Drawn by: 3909
O2 Saturation: 64.4 %
Patient temperature: 37
pCO2, Ven: 36.7 mmHg — ABNORMAL LOW (ref 44.0–60.0)
pH, Ven: 7.317 (ref 7.250–7.430)
pO2, Ven: 34.8 mmHg (ref 32.0–45.0)

## 2020-08-10 LAB — COMPREHENSIVE METABOLIC PANEL
ALT: 8 U/L (ref 0–44)
AST: 13 U/L — ABNORMAL LOW (ref 15–41)
Albumin: 2.2 g/dL — ABNORMAL LOW (ref 3.5–5.0)
Alkaline Phosphatase: 18 U/L — ABNORMAL LOW (ref 38–126)
Anion gap: 7 (ref 5–15)
BUN: 8 mg/dL (ref 6–20)
CO2: 15 mmol/L — ABNORMAL LOW (ref 22–32)
Calcium: 6.6 mg/dL — ABNORMAL LOW (ref 8.9–10.3)
Chloride: 112 mmol/L — ABNORMAL HIGH (ref 98–111)
Creatinine, Ser: 0.49 mg/dL (ref 0.44–1.00)
GFR, Estimated: >60 mL/min (ref >60)
Glucose, Bld: 145 mg/dL — ABNORMAL HIGH (ref 70–99)
Potassium: 3.8 mmol/L (ref 3.5–5.1)
Sodium: 134 mmol/L — ABNORMAL LOW (ref 135–145)
Total Bilirubin: 1 mg/dL (ref 0.3–1.2)
Total Protein: 4.1 g/dL — ABNORMAL LOW (ref 6.5–8.1)

## 2020-08-10 LAB — CBC
HCT: 25.9 % — ABNORMAL LOW (ref 36.0–46.0)
Hemoglobin: 8.7 g/dL — ABNORMAL LOW (ref 12.0–15.0)
MCH: 29.1 pg (ref 26.0–34.0)
MCHC: 33.6 g/dL (ref 30.0–36.0)
MCV: 86.6 fL (ref 80.0–100.0)
Platelets: 178 10*3/uL (ref 150–400)
RBC: 2.99 MIL/uL — ABNORMAL LOW (ref 3.87–5.11)
RDW: 13.9 % (ref 11.5–15.5)
WBC: 10.5 10*3/uL (ref 4.0–10.5)
nRBC: 0 % (ref 0.0–0.2)

## 2020-08-10 LAB — DIC (DISSEMINATED INTRAVASCULAR COAGULATION)PANEL
D-Dimer, Quant: 0.78 ug/mL-FEU — ABNORMAL HIGH (ref 0.00–0.50)
Fibrinogen: 147 mg/dL — ABNORMAL LOW (ref 210–475)
INR: 1.3 — ABNORMAL HIGH (ref 0.8–1.2)
Platelets: 176 10*3/uL (ref 150–400)
Prothrombin Time: 16.1 seconds — ABNORMAL HIGH (ref 11.4–15.2)
Smear Review: NONE SEEN
aPTT: 28 seconds (ref 24–36)

## 2020-08-10 LAB — BPAM RBC
Blood Product Expiration Date: 202203062359
ISSUE DATE / TIME: 202202222310
Unit Type and Rh: 9500

## 2020-08-10 LAB — TYPE AND SCREEN
ABO/RH(D): O POS
Antibody Screen: NEGATIVE
Unit division: 0

## 2020-08-10 LAB — LACTIC ACID, PLASMA
Lactic Acid, Venous: 2.4 mmol/L (ref 0.5–1.9)
Lactic Acid, Venous: 1.4 mmol/L (ref 0.5–1.9)

## 2020-08-10 LAB — PREPARE RBC (CROSSMATCH)

## 2020-08-10 NOTE — MAU Note (Addendum)
Initial bag of blood from [facility] complete at 0013.   New bag of blood ordered and hung at 0017 per Dr. Barb Merino, MD. VS obtained and charted.

## 2020-08-10 NOTE — MAU Note (Signed)
Patient eating sandwich tray and drinking fluids.

## 2020-08-10 NOTE — MAU Note (Signed)
BP 71/41 at 0006 with repeat BP of 75/37 at 0007. Dr. Barb Merino, MD, ordered blood infusion to be increased to 999 mL/hr.   1L Lactated Ringers bolus begun at 0007 per Dr. Barb Merino, MD.

## 2020-08-10 NOTE — Discharge Instructions (Signed)
We were able to remove retained placenta and membranes, which immediately resolved your vaginal bleeding. You also received 1 unit of red blood cells and IV fluids.  Please follow-up in clinic (the clinic will call you with a specific appointment date/time). Please call sooner if concern of recurrent heavy vaginal bleeding, lightheadedness, dizziness, syncope or other concerns.  Please wait to restart your birth control until your follow-up appointment in clinic.

## 2020-08-10 NOTE — MAU Note (Addendum)
Patient arrived to MAU at 2350 via [facility] with blood infusing but not scanned into MAR. [facility] provided [facility] with patient's Ed Transfer Consent form as well as Blood Product Administration consent form. Placed in patient's chart.  Patient's green Blood Product Administration form placed in chart.  Dr. Barb Merino, MD, and Wynelle Bourgeois, MD at bedside. RROB, Abbott Pao, at bedside.

## 2020-08-10 NOTE — MAU Provider Note (Signed)
Chief Complaint: Vaginal Bleeding (Pregnant) and Miscarriage  SUBJECTIVE HPI: Ariel Cardenas is a 30 y.o. G2P1001 at [redacted]w[redacted]d by LMP who presents to maternity admissions as a EMS transfer from [facility]. Per ED provider pt presented with heavy vaginal bleeding s/p taking cytotec at home at 1200 on 08/09/20. On arrival to ED, pt's CBC showed a drop in Hgb from 10.9 on 2/18 to 6.0. In the ED she received 3L IVF and was started on 1L of pRBCs s/p written consent. Per ED physician, membranes visible at the external os on sterile speculum exam, but large clot burden made visualization difficult. Pt's vaginal vault was packed with a single lap saturated in TXA prior to transfer.  On arrival to MAU via EMS, pt was found be hemodynamically stable with 100% oxygen saturation on room air. Pt endorsing mild numbness in hands. No report of lightheadedness, vision changes or dizziness.  Past Medical History:  Diagnosis Date  . Medical history non-contributory    Past Surgical History:  Procedure Laterality Date  . BACK SURGERY     Social History   Socioeconomic History  . Marital status: Married    Spouse name: Not on file  . Number of children: Not on file  . Years of education: Not on file  . Highest education level: Not on file  Occupational History  . Not on file  Tobacco Use  . Smoking status: Never Smoker  . Smokeless tobacco: Never Used  Vaping Use  . Vaping Use: Never used  Substance and Sexual Activity  . Alcohol use: No  . Drug use: No  . Sexual activity: Yes    Birth control/protection: None  Other Topics Concern  . Not on file  Social History Narrative   ** Merged History Encounter **       Social Determinants of Health   Financial Resource Strain: Not on file  Food Insecurity: Not on file  Transportation Needs: Not on file  Physical Activity: Not on file  Stress: Not on file  Social Connections: Not on file  Intimate Partner Violence: Not on file   No current  facility-administered medications on file prior to encounter.   Current Outpatient Medications on File Prior to Encounter  Medication Sig Dispense Refill  . misoprostol (CYTOTEC) 200 MCG tablet Place four tablets in between your gums and cheeks (two tablets on each side) - If not dissolved in 20 minutes then swallow pills with water 4 tablet 0  . norethindrone (NORLYDA) 0.35 MG tablet Take 1 tablet (0.35 mg total) by mouth daily. 90 tablet 3  . [DISCONTINUED] ferrous sulfate 325 (65 FE) MG tablet Take 1 tablet (325 mg total) by mouth 2 (two) times daily with a meal. 60 tablet 3   No Known Allergies  ROS:  Review of Systems  Constitutional: Positive for fatigue. Negative for diaphoresis and fever.  HENT: Negative for congestion and sore throat.   Eyes: Negative for photophobia and visual disturbance.  Respiratory: Negative for cough, chest tightness and shortness of breath.   Cardiovascular: Negative for chest pain.  Gastrointestinal: Negative for abdominal pain, constipation, diarrhea, nausea and vomiting.  Genitourinary: Positive for vaginal bleeding. Negative for difficulty urinating, dysuria, vaginal discharge and vaginal pain.  Musculoskeletal: Negative for back pain.  Neurological: Positive for numbness. Negative for light-headedness and headaches.  Psychiatric/Behavioral: Negative for confusion.   I have reviewed patient's Past Medical Hx, Surgical Hx, Family Hx, Social Hx, medications and allergies.   Physical Exam   Patient  Vitals for the past 24 hrs:  BP Temp Temp src Pulse Resp SpO2 Height Weight  08/10/20 0402 96/65 -- -- 90 -- 100 % -- --  08/10/20 0345 94/63 98.4 F (36.9 C) Oral 84 16 100 % -- --  08/10/20 0330 101/68 -- -- 88 -- 100 % -- --  08/10/20 0315 -- -- -- -- -- 100 % -- --  08/10/20 0300 98/65 -- -- 89 -- 100 % -- --  08/10/20 0250 98/69 -- -- (!) 103 -- 100 % -- --  08/10/20 0245 -- -- -- -- -- 100 % -- --  08/10/20 0230 97/65 -- -- 88 -- 100 % -- --   08/10/20 0215 -- -- -- -- -- 100 % -- --  08/10/20 0200 101/66 -- -- 94 -- 100 % -- --  08/10/20 0155 100/63 -- -- 89 -- 100 % -- --  08/10/20 0150 105/69 -- -- 92 -- 100 % -- --  08/10/20 0145 100/62 98.4 F (36.9 C) Oral 93 17 100 % -- --  08/10/20 0140 103/66 -- -- 92 -- -- -- --  08/10/20 0135 104/69 -- -- 100 -- 100 % -- --  08/10/20 0131 100/68 -- -- 98 -- 100 % -- --  08/10/20 0125 105/69 -- -- 98 -- 100 % -- --  08/10/20 0115 96/63 98.5 F (36.9 C) Oral 93 17 100 % -- --  08/10/20 0110 95/71 -- -- 95 -- -- -- --  08/10/20 0105 106/74 -- -- 91 -- 100 % -- --  08/10/20 0100 100/67 98.4 F (36.9 C) Oral 94 -- 100 % -- --  08/10/20 0055 95/61 -- -- 92 -- -- -- --  08/10/20 0050 100/61 -- -- 96 -- 100 % -- --  08/10/20 0045 96/61 98.5 F (36.9 C) Oral 98 17 100 % -- --  08/10/20 0040 90/65 -- -- (!) 102 -- 100 % -- --  08/10/20 0038 (!) 89/66 -- -- 98 19 100 % -- --  08/10/20 0030 94/62 98.4 F (36.9 C) Oral 96 17 100 % -- --  08/10/20 0025 94/66 -- -- (!) 102 18 100 % -- --  08/10/20 0020 (!) 82/55 -- -- (!) 116 17 100 % -- --  08/10/20 0017 (!) 98/58 99 F (37.2 C) Oral 100 (!) 21 100 % -- --  08/10/20 0015 (!) 98/58 -- -- 100 -- 100 % -- --  08/10/20 0013 95/65 -- -- (!) 101 -- 100 % -- --  08/10/20 0010 (!) 85/37 -- -- (!) 105 -- 100 % -- --  08/10/20 0007 (!) 75/37 -- -- 99 -- 100 % -- --  08/10/20 0006 (!) 71/41 -- -- 92 -- 100 % -- --  08/09/20 2355 112/69 -- -- (!) 122 -- 100 % -- --  08/09/20 2245 (!) 92/57 -- -- (!) 107 (!) 22 100 % -- --  08/09/20 2235 (!) 76/54 -- -- 99 -- 100 % -- --  08/09/20 2145 97/67 -- -- 100 (!) 22 100 % -- --  08/09/20 2130 103/60 -- -- (!) 101 (!) 22 100 % -- --  08/09/20 2115 100/74 -- -- (!) 105 (!) 22 100 % -- --  08/09/20 2100 101/65 -- -- (!) 109 (!) 22 100 % -- --  08/09/20 2050 100/81 -- -- 95 20 100 % -- --  08/09/20 2039 92/72 -- -- (!) 101 18 100 % -- --  08/09/20 1947 104/88 -- -- (!) 114 18 100 %  5\' 1"  (1.549 m) 64 kg    Constitutional: Well-developed, well-nourished female in no acute distress. Cardiovascular: mild tachycardia Respiratory: normal work of breathing GI: Abd soft, non-tender. MS: Extremities nontender, no edema, normal ROM Neurologic: Alert and oriented x 4. No gross neurologic deficits. Extremities: Cool with 4+ capillary refill.  PELVIC EXAM: Cervix pink, slightly dilated, without lesion, vaginal walls and external genitalia normal. Multiple large clots extracted allowing for visualization of membranes and placenta partially protruding from the external os. S/p extraction, immediate resolution of bleeding.  LAB RESULTS Results for orders placed or performed during the hospital encounter of 08/09/20 (from the past 24 hour(s))  CBC with Differential/Platelet     Status: Abnormal   Collection Time: 08/09/20  8:37 PM  Result Value Ref Range   WBC 13.9 (H) 4.0 - 10.5 K/uL   RBC 3.39 (L) 3.87 - 5.11 MIL/uL   Hemoglobin 9.1 (L) 12.0 - 15.0 g/dL   HCT 21.328.1 (L) 08.636.0 - 57.846.0 %   MCV 82.9 80.0 - 100.0 fL   MCH 26.8 26.0 - 34.0 pg   MCHC 32.4 30.0 - 36.0 g/dL   RDW 46.913.1 62.911.5 - 52.815.5 %   Platelets 301 150 - 400 K/uL   nRBC 0.0 0.0 - 0.2 %   Neutrophils Relative % 77 %   Neutro Abs 10.7 (H) 1.7 - 7.7 K/uL   Lymphocytes Relative 17 %   Lymphs Abs 2.3 0.7 - 4.0 K/uL   Monocytes Relative 5 %   Monocytes Absolute 0.7 0.1 - 1.0 K/uL   Eosinophils Relative 0 %   Eosinophils Absolute 0.0 0.0 - 0.5 K/uL   Basophils Relative 0 %   Basophils Absolute 0.1 0.0 - 0.1 K/uL   Immature Granulocytes 1 %   Abs Immature Granulocytes 0.10 (H) 0.00 - 0.07 K/uL  Comprehensive metabolic panel     Status: Abnormal   Collection Time: 08/09/20  8:37 PM  Result Value Ref Range   Sodium 131 (L) 135 - 145 mmol/L   Potassium 3.6 3.5 - 5.1 mmol/L   Chloride 103 98 - 111 mmol/L   CO2 17 (L) 22 - 32 mmol/L   Glucose, Bld 126 (H) 70 - 99 mg/dL   BUN 12 6 - 20 mg/dL   Creatinine, Ser 4.130.59 0.44 - 1.00 mg/dL   Calcium  8.2 (L) 8.9 - 10.3 mg/dL   Total Protein 6.3 (L) 6.5 - 8.1 g/dL   Albumin 3.5 3.5 - 5.0 g/dL   AST 18 15 - 41 U/L   ALT 11 0 - 44 U/L   Alkaline Phosphatase 24 (L) 38 - 126 U/L   Total Bilirubin 0.7 0.3 - 1.2 mg/dL   GFR, Estimated >24>60 >40>60 mL/min   Anion gap 11 5 - 15  hCG, quantitative, pregnancy     Status: Abnormal   Collection Time: 08/09/20  8:37 PM  Result Value Ref Range   hCG, Beta Chain, Quant, S 510 (H) <5 mIU/mL  CBC with Differential/Platelet     Status: Abnormal   Collection Time: 08/09/20 10:44 PM  Result Value Ref Range   WBC 9.4 4.0 - 10.5 K/uL   RBC 2.15 (L) 3.87 - 5.11 MIL/uL   Hemoglobin 6.0 (LL) 12.0 - 15.0 g/dL   HCT 10.218.0 (L) 72.536.0 - 36.646.0 %   MCV 83.7 80.0 - 100.0 fL   MCH 27.9 26.0 - 34.0 pg   MCHC 33.3 30.0 - 36.0 g/dL   RDW 44.013.2 34.711.5 - 42.515.5 %   Platelets 192  150 - 400 K/uL   nRBC 0.0 0.0 - 0.2 %   Neutrophils Relative % 82 %   Neutro Abs 7.7 1.7 - 7.7 K/uL   Lymphocytes Relative 14 %   Lymphs Abs 1.3 0.7 - 4.0 K/uL   Monocytes Relative 3 %   Monocytes Absolute 0.3 0.1 - 1.0 K/uL   Eosinophils Relative 0 %   Eosinophils Absolute 0.0 0.0 - 0.5 K/uL   Basophils Relative 0 %   Basophils Absolute 0.0 0.0 - 0.1 K/uL   Immature Granulocytes 1 %   Abs Immature Granulocytes 0.07 0.00 - 0.07 K/uL  CBG monitoring, ED     Status: None   Collection Time: 08/09/20 11:22 PM  Result Value Ref Range   Glucose-Capillary 94 70 - 99 mg/dL   Comment 1 Notify RN   Type and screen Ordered by PROVIDER DEFAULT     Status: None (Preliminary result)   Collection Time: 08/09/20 11:23 PM  Result Value Ref Range   ABO/RH(D) O POS    Antibody Screen NEG    Sample Expiration 08/12/2020,2359    Unit Number [id number]    Blood Component Type RED CELLS,LR    Unit division 00    Status of Unit ISSUED    Unit tag comment VERBAL ORDERS PER DR YAO    Transfusion Status OK TO TRANSFUSE    Crossmatch Result COMPATIBLE   Prepare RBC (crossmatch)     Status: None   Collection  Time: 08/09/20 11:52 PM  Result Value Ref Range   Order Confirmation      ORDER PROCESSED BY BLOOD BANK Performed at [facility], [address], [place], [place] [address]   CBC     Status: Abnormal   Collection Time: 08/10/20 12:41 AM  Result Value Ref Range   WBC 10.5 4.0 - 10.5 K/uL   RBC 2.99 (L) 3.87 - 5.11 MIL/uL   Hemoglobin 8.7 (L) 12.0 - 15.0 g/dL   HCT 18.2 (L) 99.3 - 71.6 %   MCV 86.6 80.0 - 100.0 fL   MCH 29.1 26.0 - 34.0 pg   MCHC 33.6 30.0 - 36.0 g/dL   RDW 96.7 89.3 - 81.0 %   Platelets 178 150 - 400 K/uL   nRBC 0.0 0.0 - 0.2 %  Comprehensive metabolic panel     Status: Abnormal   Collection Time: 08/10/20 12:41 AM  Result Value Ref Range   Sodium 134 (L) 135 - 145 mmol/L   Potassium 3.8 3.5 - 5.1 mmol/L   Chloride 112 (H) 98 - 111 mmol/L   CO2 15 (L) 22 - 32 mmol/L   Glucose, Bld 145 (H) 70 - 99 mg/dL   BUN 8 6 - 20 mg/dL   Creatinine, Ser 1.75 0.44 - 1.00 mg/dL   Calcium 6.6 (L) 8.9 - 10.3 mg/dL   Total Protein 4.1 (L) 6.5 - 8.1 g/dL   Albumin 2.2 (L) 3.5 - 5.0 g/dL   AST 13 (L) 15 - 41 U/L   ALT 8 0 - 44 U/L   Alkaline Phosphatase 18 (L) 38 - 126 U/L   Total Bilirubin 1.0 0.3 - 1.2 mg/dL   GFR, Estimated >10 >25 mL/min   Anion gap 7 5 - 15  DIC Panel ONCE - STAT     Status: Abnormal   Collection Time: 08/10/20 12:41 AM  Result Value Ref Range   Prothrombin Time 16.1 (H) 11.4 - 15.2 seconds   INR 1.3 (H) 0.8 - 1.2  aPTT 28 24 - 36 seconds   Fibrinogen 147 (L) 210 - 475 mg/dL   D-Dimer, Quant 1.61 (H) 0.00 - 0.50 ug/mL-FEU   Platelets 176 150 - 400 K/uL   Smear Review NO SCHISTOCYTES SEEN   Lactic acid, plasma     Status: Abnormal   Collection Time: 08/10/20 12:41 AM  Result Value Ref Range   Lactic Acid, Venous 2.4 (HH) 0.5 - 1.9 mmol/L  Type and screen     Status: None (Preliminary result)   Collection Time: 08/10/20 12:41 AM  Result Value Ref Range   ABO/RH(D) O POS    Antibody Screen NEG    Sample Expiration       08/13/2020,2359 Performed at [facility], [address], [place], [place] [address]    Unit Number [id number]    Blood Component Type RED CELLS,LR    Unit division 00    Status of Unit ISSUED    Unit tag comment EMERGENCY RELEASE    Transfusion Status OK TO TRANSFUSE    Crossmatch Result COMPATIBLE   Blood gas, venous     Status: Abnormal   Collection Time: 08/10/20  3:08 AM  Result Value Ref Range   pH, Ven 7.317 7.250 - 7.430   pCO2, Ven 36.7 (L) 44.0 - 60.0 mmHg   pO2, Ven 34.8 32.0 - 45.0 mmHg   Bicarbonate 18.2 (L) 20.0 - 28.0 mmol/L   Acid-base deficit 6.8 (H) 0.0 - 2.0 mmol/L   O2 Saturation 64.4 %   Patient temperature 37.0    Collection site VEIN    Drawn by [id number]    Sample type VENOUS   Lactic acid, plasma     Status: None   Collection Time: 08/10/20  3:08 AM  Result Value Ref Range   Lactic Acid, Venous 1.4 0.5 - 1.9 mmol/L    --/--/O POS (02/23 0041)  IMAGING US OB Comp < 14 Wks  Result Date: 08/09/2020 CLINICAL DATA:  Heavy vaginal bleeding sent to rule out retained products of conception. EXAM: OBSTETRIC <14 WK ULTRASOUND TECHNIQUE: Transabdominal ultrasound was performed for evaluation of the gestation as well as the maternal uterus and adnexal regions. COMPARISON:  July 12, 2020 FINDINGS: It should be noted that the study is markedly limited secondary to heavy vaginal bleeding and patient pain. As a result, the transvaginal portion of the study was not performed. Imaging of the bilateral ovaries is also limited. Intrauterine gestational sac: None Yolk sac:  Not Visualized. Embryo:  Not Visualized. Cardiac Activity: Not Visualized. Heart Rate: N/A bpm Subchorionic hemorrhage:  None visualized. Maternal uterus/adnexae: The endometrium appears to be thickened by visual appreciation (ultrasonographic measurements not provided). A mild amount of echogenic material is also seen within the endometrial canal. No abnormal flow is seen within this region on color  Doppler evaluation. An ill-defined hypoechoic sac-like structure is seen extending from the lower uterine segment. No abnormal flow is seen within this region on color Doppler evaluation. No pelvic free fluid is identified. IMPRESSION: 1. Markedly limited study, as described above, without evidence of an intrauterine pregnancy. 2. Suspected endometrial thickening with additional findings suggestive of a mild amount of hemorrhagic products within the endometrial canal. 3. No evidence of retained products of conception within the endometrial canal. 4. Anechoic structure extending from the lower uterine segment which may represent an empty gestational sac. Electronically Signed   By: Aram Candela M.D.   On: 08/09/2020 22:46   US OB Transvaginal  Addendum Date: 07/12/2020  ADDENDUM REPORT: 07/12/2020 13:24 ADDENDUM: There is an image on our time labeled Defne Halk showing an apparent intrauterine gestation from earlier today. I spoke directly to Steward Drone who informs me that this image was of a different patient and that there is no image from earlier in the day for current patient. Electronically Signed   By: Bretta Bang III M.D.   On: 07/12/2020 13:24   Result Date: 07/12/2020 CLINICAL DATA:  Uncertain location of pregnancy EXAM: TRANSVAGINAL OB ULTRASOUND TECHNIQUE: Transvaginal ultrasound was performed for complete evaluation of the gestation as well as the maternal uterus, adnexal regions, and pelvic cul-de-sac. COMPARISON:  None. FINDINGS: Intrauterine gestational sac: Not visualized Yolk sac:  Not visualized Embryo:  Not visualized Cardiac Activity: Not visualized Subchorionic hemorrhage:  None visualized. Maternal uterus/adnexae: Within a thickened endometrium, there is a fluid collection measuring 3.9 x 1.1 cm. This area does not appear sac-like. There is mild fluid in the cervix. Right ovary measures 2.3 x 1.7 x 1.3 cm. Left ovary measures 2.4 x 1.0 x 1.2 cm. No extrauterine pelvic mass.  No appreciable free fluid. IMPRESSION: There is no evident intrauterine gestation. There is fluid in the endometrium as well as mild fluid in the cervix. Appearance raises question of recent spontaneous abortion. This circumstance warrants clinical assessment as well as beta HCG correlation. Timing of repeat ultrasound in part will depend on beta HCG values going forward. Electronically Signed: By: Bretta Bang III M.D. On: 07/12/2020 12:03    MAU Management/MDM: Orders Placed This Encounter  Procedures  . US OB Comp < 14 Wks  . CBC with Differential/Platelet  . Comprehensive metabolic panel  . hCG, quantitative, pregnancy  . CBC with Differential/Platelet  . CBC  . Comprehensive metabolic panel  . DIC Panel ONCE - STAT  . Lactic acid, plasma  . Blood gas, venous  . Lactic acid, plasma  . Pelvic cart  . Informed Consent Details: Physician/Practitioner Attestation; Transcribe to consent form and obtain patient signature  . Consult to obstetrics / gynecology  ALL PATIENTS BEING ADMITTED/HAVING PROCEDURES NEED COVID-19 SCREENING  . CBG monitoring, ED  . EKG 12-Lead  . Prepare RBC (crossmatch)  . Type and screen  . Type and screen  . Discharge patient Discharge disposition: 01-Home or Self Care; Discharge patient date: 08/10/2020    Meds ordered this encounter  Medications  . sodium chloride 0.9 % bolus 1,000 mL  . sodium chloride 0.9 % bolus 1,000 mL  . 0.9 %  sodium chloride infusion  . tranexamic acid (CYKLOKAPRON) injection 500 mg  . sodium chloride 0.9 % bolus 1,000 mL  . tranexamic acid (CYKLOKAPRON) 1000 MG/10ML injection    Waldo Laine   : cabinet override  . HYDROmorphone (DILAUDID) injection 1 mg  . HYDROmorphone (DILAUDID) 1 MG/ML injection    Marita [place], [place]   : cabinet override    Treatments in MAU included pRBCs, IVF and extraction of retained membranes and placenta. Sent to surgical pathology for further evaluation. QBL in MAU 855 ml.  Labs notable for repeat  H&H of 8.7 & 25.9 respectively; initial lactic acid 2.4. DIC panel notable for PT 16.1, INR 1.3, PTT wnl, fibrinogen 147, D-dimer 0.78, and plts wnl. Reassuringly, no recurrent vaginal bleeding since extraction of retained membranes and placenta.  ASSESSMENT & PLAN: 1. Hemorrhage due to retained placenta s/p SAB: Pt is a 30 year old G63P1001 female who [facility] given concern of heavy bleeding s/p administration of cytotec for SAB  on 08/09/20 at 1200. On arrival to outside ER, pt was noted to have heavy bleeding with hemodynamic instability and drop in Hgb from 10.9 on 08/05/20 to 6.0. She received 3L IVF and was started on 1u pRBCs prior to transfer to MAU. On arrival to MAU, pt was found to be hemodynamically stable with 100% O2 saturation on room air. Complete resolution of vaginal bleeding s/p extraction of retained placenta and membranes with use of speculum and long ring forceps (sent to pathology). Repeat labs notable for appropriate increase in Hgb to 8.7 and normalization in lactic acid. Slightly elevated PTT, d-dimer and fibrinogen 147 but reassuringly, resolution of bleeding as noted above with normal platelets. Vital signs stable and pt asymptomatic at time of discharge. Pt's partner present. -plan for close follow-up in clinic with repeat beta-hcg and CBC (message sent to [facility] to schedule appt for 2/25) -provided strict return precautions for concern of recurrent vaginal bleeding, light-headedness/dizziness, syncope or other concerns    Allergies as of 08/10/2020   No Known Allergies     Medication List    STOP taking these medications   misoprostol 200 MCG tablet Commonly known as: CYTOTEC     TAKE these medications   norethindrone 0.35 MG tablet Commonly known as: Norlyda Take 1 tablet (0.35 mg total) by mouth daily.       Follow-up Information    [facility] [place] Follow up on 08/12/2020.   Specialty:  Obstetrics and Gynecology Why: Please call sooner if concern of recurrent heavy vaginal bleeding, light-headedness/dizziness or other concerns. Contact information: [address] [place] [address] [phone]             Sheila Oats, MD OB Fellow, Faculty Practice 08/10/2020 4:23 AM

## 2020-08-11 LAB — TYPE AND SCREEN
ABO/RH(D): O POS
Antibody Screen: NEGATIVE
Unit division: 0

## 2020-08-11 LAB — BPAM RBC
Blood Product Expiration Date: 202203022359
ISSUE DATE / TIME: 202202230001
Unit Type and Rh: 9500

## 2020-08-11 LAB — SURGICAL PATHOLOGY

## 2020-08-12 ENCOUNTER — Other Ambulatory Visit: Payer: Self-pay

## 2020-08-12 ENCOUNTER — Ambulatory Visit (INDEPENDENT_AMBULATORY_CARE_PROVIDER_SITE_OTHER): Payer: Medicaid Other | Admitting: Certified Nurse Midwife

## 2020-08-12 ENCOUNTER — Encounter: Payer: Self-pay | Admitting: Certified Nurse Midwife

## 2020-08-12 VITALS — BP 116/77 | HR 95 | Resp 16 | Ht 61.0 in | Wt 143.0 lb

## 2020-08-12 DIAGNOSIS — O039 Complete or unspecified spontaneous abortion without complication: Secondary | ICD-10-CM | POA: Diagnosis not present

## 2020-08-12 NOTE — Progress Notes (Signed)
   GYNECOLOGY OFFICE VISIT NOTE  History:  30 y.o. G2P1001 here today for miscarriage follow up. She was seen in MAU 2 days ago with heavy bleeding after taking Cytotec the day before for MAB in first trimester. Her hgb dropped from 10.9 to 6.0 and she was transfused with 1u PRBcs. Her bleeding stabalized after POCs were removed from cervix. Post transfusion Hgb was 8.7. She reports light spotting now. Denies pain. Emotionally feels ok, has good social support. No depressive signs or SI. She denies orthostatic sx. She is not taking Fe. She is planning to use POPs for contraception.  Past Medical History:  Diagnosis Date  . Medical history non-contributory     Past Surgical History:  Procedure Laterality Date  . BACK SURGERY      The following portions of the patient's history were reviewed and updated as appropriate: allergies, current medications, past family history, past medical history, past social history, past surgical history and problem list.    Review of Systems:  Negative except noted in HPI  Objective:  Physical Exam BP 116/77   Pulse 95   Resp 16   Ht 5\' 1"  (1.549 m)   Wt 143 lb (64.9 kg)   LMP 08/03/2020   BMI 27.02 kg/m  CONSTITUTIONAL: Well-developed, well-nourished female in no acute distress.  HENT:  Normocephalic, atraumatic EYES: Conjunctivae and EOM are normal NECK: Normal range of motion NEUROLOGIC: Alert and oriented to person, place, and time PSYCHIATRIC: Normal mood and affect CARDIOVASCULAR: Normal heart rate noted RESPIRATORY: Effort and rate normal  PELVIC: deferred MUSCULOSKELETAL: Normal range of motion  Labs and Imaging No results found for this or any previous visit (from the past 24 hour(s)).  Assessment & Plan:   1. SAB (spontaneous abortion)   - CBC - qHGC  Follow up weekly quants until negative Start POPs in 3 weeks or with first menstrual cycle, back up method until then No IC until bleeding fully stops Continue PNV w/Fe  daily  Total face-to-face time with patient: 10 minutes   08/05/2020, Donette Larry 08/12/2020 11:37 AM

## 2020-08-13 LAB — CBC
HCT: 23.8 % — ABNORMAL LOW (ref 35.0–45.0)
Hemoglobin: 7.7 g/dL — ABNORMAL LOW (ref 11.7–15.5)
MCH: 29.1 pg (ref 27.0–33.0)
MCHC: 32.4 g/dL (ref 32.0–36.0)
MCV: 89.8 fL (ref 80.0–100.0)
MPV: 10.3 fL (ref 7.5–12.5)
Platelets: 211 10*3/uL (ref 140–400)
RBC: 2.65 10*6/uL — ABNORMAL LOW (ref 3.80–5.10)
RDW: 14.1 % (ref 11.0–15.0)
WBC: 6 10*3/uL (ref 3.8–10.8)

## 2020-08-13 LAB — HCG, QUANTITATIVE, PREGNANCY: HCG, Total, QN: 40 m[IU]/mL

## 2020-08-14 ENCOUNTER — Other Ambulatory Visit: Payer: Self-pay | Admitting: Certified Nurse Midwife

## 2020-08-14 DIAGNOSIS — D62 Acute posthemorrhagic anemia: Secondary | ICD-10-CM

## 2020-08-14 MED ORDER — POLYSACCHARIDE IRON COMPLEX 150 MG PO CAPS: 150.0000 mg | ORAL_CAPSULE | Freq: Every day | ORAL | 1 refills | Status: DC

## 2020-08-14 NOTE — Progress Notes (Signed)
Pt notified via My Chart

## 2020-08-15 ENCOUNTER — Telehealth: Payer: Self-pay | Admitting: *Deleted

## 2020-08-15 NOTE — Telephone Encounter (Signed)
Message sent through my chart for pt to come into the office once weekly until she has a neg BHCG.

## 2020-08-15 NOTE — Telephone Encounter (Signed)
-----   Message from Donette Larry, [place] sent at 08/14/2020 11:12 AM EST ----- Needs weekly qhcg until negative

## 2020-08-19 ENCOUNTER — Other Ambulatory Visit: Payer: Self-pay | Admitting: Obstetrics and Gynecology

## 2020-08-19 ENCOUNTER — Other Ambulatory Visit: Payer: Self-pay

## 2020-08-19 ENCOUNTER — Other Ambulatory Visit: Payer: Medicaid Other

## 2020-08-19 DIAGNOSIS — O039 Complete or unspecified spontaneous abortion without complication: Secondary | ICD-10-CM

## 2020-08-19 NOTE — Progress Notes (Signed)
Pt here for lab only visit. Labs printed and pt sent to lab 

## 2020-08-19 NOTE — Addendum Note (Signed)
Addended by: Leola Brazil on: 08/19/2020 10:09 AM   Modules accepted: Orders

## 2020-08-20 LAB — HCG, QUANTITATIVE, PREGNANCY: HCG, Total, QN: 4 m[IU]/mL

## 2021-01-26 ENCOUNTER — Other Ambulatory Visit: Payer: Self-pay

## 2021-01-26 ENCOUNTER — Ambulatory Visit (INDEPENDENT_AMBULATORY_CARE_PROVIDER_SITE_OTHER): Payer: Medicaid Other

## 2021-01-26 DIAGNOSIS — Z3202 Encounter for pregnancy test, result negative: Secondary | ICD-10-CM | POA: Diagnosis not present

## 2021-01-26 LAB — POCT PREGNANCY, URINE: Preg Test, Ur: NEGATIVE

## 2021-01-26 NOTE — Progress Notes (Signed)
Pt dropped off urine today for UPT. UPT is negative. Call placed to pt and given results. Pt advised to retake UPT in 1-2 weeks at home or at [facility].  Pt denies any vaginal bleeding, abd pain or cramps at this time. Pt verbalized understanding and agreeable to plan of care.   LMP: 01/04/21-approx. EDC: 10/11/21 [redacted]w[redacted]d   Judeth Cornfield, RN

## 2021-03-13 ENCOUNTER — Encounter: Payer: Self-pay | Admitting: *Deleted

## 2021-03-13 DIAGNOSIS — Z3480 Encounter for supervision of other normal pregnancy, unspecified trimester: Secondary | ICD-10-CM | POA: Insufficient documentation

## 2021-03-14 ENCOUNTER — Ambulatory Visit (INDEPENDENT_AMBULATORY_CARE_PROVIDER_SITE_OTHER): Payer: Medicaid Other

## 2021-03-14 ENCOUNTER — Other Ambulatory Visit (HOSPITAL_COMMUNITY)
Admission: RE | Admit: 2021-03-14 | Discharge: 2021-03-14 | Disposition: A | Discharge status: Home / Self Care | Payer: Medicaid Other | Source: Ambulatory Visit

## 2021-03-14 ENCOUNTER — Other Ambulatory Visit: Payer: Self-pay

## 2021-03-14 VITALS — BP 122/76 | HR 89 | Wt 151.0 lb

## 2021-03-14 DIAGNOSIS — Z8759 Personal history of other complications of pregnancy, childbirth and the puerperium: Secondary | ICD-10-CM

## 2021-03-14 DIAGNOSIS — Z3481 Encounter for supervision of other normal pregnancy, first trimester: Secondary | ICD-10-CM | POA: Diagnosis not present

## 2021-03-14 DIAGNOSIS — Z3480 Encounter for supervision of other normal pregnancy, unspecified trimester: Secondary | ICD-10-CM

## 2021-03-14 DIAGNOSIS — Z3A11 11 weeks gestation of pregnancy: Secondary | ICD-10-CM | POA: Diagnosis not present
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
## 2021-03-14 NOTE — Progress Notes (Signed)
DATING AND VIABILITY SONOGRAM   Ariel Cardenas is a 30 y.o. year old G50P1011 with LMP Patient's last menstrual period was 01/04/2021. which would correlate to  [redacted]w[redacted]d weeks gestation.  She has regular menstrual cycles.   She is here today for a confirmatory initial sonogram.    GESTATION: SINGLETON-yes     FETAL ACTIVITY:          Heart rate         174          The fetus is active.  PLACENTA LOCALIZATION:        ADNEXA: The ovaries are normal.   GESTATIONAL AGE AND  BIOMETRICS:  Gestational criteria: Estimated Date of Delivery: 10/04/21 by early ultrasound now at [redacted]w[redacted]d  Previous Scans:0      CROWN RUMP LENGTH           39.63 mm         10 weeks 6d                                                                               AVERAGE EGA(BY THIS SCAN):  10 weeks 6d  WORKING E DD( early ultrasound ):       TECHNICIAN COMMENTS:  10/04/21   A copy of this report including all images has been saved and backed up to a second source for retrieval if needed. All measures and details of the anatomical scan, placentation, fluid volume and pelvic anatomy are contained in that report.  Granville Lewis 03/14/2021 10:09 AM

## 2021-03-15 LAB — COMPREHENSIVE METABOLIC PANEL
AG Ratio: 1.3 (calc) (ref 1.0–2.5)
ALT: 10 U/L (ref 6–29)
AST: 11 U/L (ref 10–30)
Albumin: 3.9 g/dL (ref 3.6–5.1)
Alkaline phosphatase (APISO): 30 U/L — ABNORMAL LOW (ref 31–125)
BUN: 10 mg/dL (ref 7–25)
CO2: 23 mmol/L (ref 20–32)
Calcium: 9.4 mg/dL (ref 8.6–10.2)
Chloride: 105 mmol/L (ref 98–110)
Creat: 0.56 mg/dL (ref 0.50–0.97)
Globulin: 3 g/dL (calc) (ref 1.9–3.7)
Glucose, Bld: 65 mg/dL (ref 65–139)
Potassium: 4.4 mmol/L (ref 3.5–5.3)
Sodium: 136 mmol/L (ref 135–146)
Total Bilirubin: 0.3 mg/dL (ref 0.2–1.2)
Total Protein: 6.9 g/dL (ref 6.1–8.1)

## 2021-03-15 LAB — OBSTETRIC PANEL
Absolute Monocytes: 603 cells/uL (ref 200–950)
Antibody Screen: NOT DETECTED
Basophils Absolute: 54 cells/uL (ref 0–200)
Basophils Relative: 0.6 %
Eosinophils Absolute: 54 cells/uL (ref 15–500)
Eosinophils Relative: 0.6 %
HCT: 34.2 % — ABNORMAL LOW (ref 35.0–45.0)
Hemoglobin: 11 g/dL — ABNORMAL LOW (ref 11.7–15.5)
Hepatitis B Surface Ag: NONREACTIVE
Lymphs Abs: 1485 cells/uL (ref 850–3900)
MCH: 26.6 pg — ABNORMAL LOW (ref 27.0–33.0)
MCHC: 32.2 g/dL (ref 32.0–36.0)
MCV: 82.6 fL (ref 80.0–100.0)
MPV: 10.9 fL (ref 7.5–12.5)
Monocytes Relative: 6.7 %
Neutro Abs: 6804 cells/uL (ref 1500–7800)
Neutrophils Relative %: 75.6 %
Platelets: 276 10*3/uL (ref 140–400)
RBC: 4.14 10*6/uL (ref 3.80–5.10)
RDW: 12.8 % (ref 11.0–15.0)
RPR Ser Ql: NONREACTIVE
Rubella: 1.52 Index
Total Lymphocyte: 16.5 %
WBC: 9 10*3/uL (ref 3.8–10.8)

## 2021-03-15 LAB — CYTOLOGY - PAP
Adequacy: ABSENT
Comment: NEGATIVE
Diagnosis: NEGATIVE
High risk HPV: NEGATIVE

## 2021-03-15 LAB — PROTEIN / CREATININE RATIO, URINE
Creatinine, Urine: 167 mg/dL (ref 20–275)
Protein/Creat Ratio: 108 mg/g creat (ref 24–184)
Protein/Creatinine Ratio: 0.108 mg/mg creat (ref 0.024–0.184)
Total Protein, Urine: 18 mg/dL (ref 5–24)

## 2021-03-15 LAB — CERVICOVAGINAL ANCILLARY ONLY
Chlamydia: NEGATIVE
Comment: NEGATIVE
Comment: NORMAL
Neisseria Gonorrhea: NEGATIVE

## 2021-03-15 LAB — HEPATITIS C ANTIBODY
Hepatitis C Ab: NONREACTIVE
SIGNAL TO CUT-OFF: 0.02 (ref <1.00)

## 2021-03-15 LAB — CULTURE, OB URINE

## 2021-03-15 LAB — HIV ANTIBODY (ROUTINE TESTING W REFLEX): HIV 1&2 Ab, 4th Generation: NONREACTIVE

## 2021-03-28 ENCOUNTER — Ambulatory Visit (INDEPENDENT_AMBULATORY_CARE_PROVIDER_SITE_OTHER): Payer: Medicaid Other | Admitting: *Deleted

## 2021-03-28 ENCOUNTER — Other Ambulatory Visit: Payer: Self-pay

## 2021-03-28 DIAGNOSIS — Z36 Encounter for antenatal screening for chromosomal anomalies: Secondary | ICD-10-CM | POA: Diagnosis not present

## 2021-03-28 DIAGNOSIS — Z3480 Encounter for supervision of other normal pregnancy, unspecified trimester: Secondary | ICD-10-CM

## 2021-03-28 DIAGNOSIS — Z3A12 12 weeks gestation of pregnancy: Secondary | ICD-10-CM | POA: Diagnosis not present

## 2021-03-28 NOTE — Progress Notes (Signed)
Pt here for NIPT lab only

## 2021-04-06 DIAGNOSIS — Z3480 Encounter for supervision of other normal pregnancy, unspecified trimester: Secondary | ICD-10-CM

## 2021-04-12 ENCOUNTER — Ambulatory Visit (INDEPENDENT_AMBULATORY_CARE_PROVIDER_SITE_OTHER): Payer: Medicaid Other

## 2021-04-12 ENCOUNTER — Other Ambulatory Visit: Payer: Self-pay

## 2021-04-12 VITALS — BP 107/70 | HR 88 | Wt 153.0 lb

## 2021-04-12 DIAGNOSIS — Z3A15 15 weeks gestation of pregnancy: Secondary | ICD-10-CM

## 2021-04-12 DIAGNOSIS — Z3480 Encounter for supervision of other normal pregnancy, unspecified trimester: Secondary | ICD-10-CM

## 2021-04-12 DIAGNOSIS — Z7189 Other specified counseling: Secondary | ICD-10-CM | POA: Diagnosis not present

## 2021-04-12 DIAGNOSIS — Z3482 Encounter for supervision of other normal pregnancy, second trimester: Secondary | ICD-10-CM

## 2021-04-12 MED ORDER — ASPIRIN 81 MG PO CHEW: 81.0000 mg | CHEWABLE_TABLET | Freq: Every day | ORAL | 11 refills | Status: DC

## 2021-04-12 NOTE — Patient Instructions (Signed)
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
## 2021-05-09 ENCOUNTER — Other Ambulatory Visit: Payer: Self-pay

## 2021-05-09 ENCOUNTER — Ambulatory Visit (INDEPENDENT_AMBULATORY_CARE_PROVIDER_SITE_OTHER): Payer: Medicaid Other | Admitting: Obstetrics and Gynecology

## 2021-05-09 VITALS — BP 109/72 | HR 83 | Wt 158.0 lb

## 2021-05-09 DIAGNOSIS — Z3480 Encounter for supervision of other normal pregnancy, unspecified trimester: Secondary | ICD-10-CM

## 2021-05-09 DIAGNOSIS — O139 Gestational [pregnancy-induced] hypertension without significant proteinuria, unspecified trimester: Secondary | ICD-10-CM

## 2021-05-09 NOTE — Progress Notes (Signed)
   PRENATAL VISIT NOTE  Subjective:  Ariel Cardenas is a 30 y.o. G3P1011 at [redacted]w[redacted]d being seen today for ongoing prenatal care.  She is currently monitored for the following issues for this low-risk pregnancy and has SCOLIOSIS; Gestational hypertension; and Supervision of other normal pregnancy, antepartum on their problem list.  Patient reports no complaints.  Contractions: Not present. Vag. Bleeding: None.  Movement: Present. Denies leaking of fluid.   The following portions of the patient's history were reviewed and updated as appropriate: allergies, current medications, past family history, past medical history, past social history, past surgical history and problem list.   Objective:   Vitals:   05/09/21 0942  BP: 109/72  Pulse: 83  Weight: 158 lb (71.7 kg)    Fetal Status: Fetal Heart Rate (bpm): 145   Movement: Present     General:  Alert, oriented and cooperative. Patient is in no acute distress.  Skin: Skin is warm and dry. No rash noted.   Cardiovascular: Normal heart rate noted  Respiratory: Normal respiratory effort, no problems with respiration noted  Abdomen: Soft, gravid, appropriate for gestational age.  Pain/Pressure: Absent     Pelvic: Cervical exam deferred        Extremities: Normal range of motion.  Edema: None  Mental Status: Normal mood and affect. Normal behavior. Normal judgment and thought content.   Assessment and Plan:  Pregnancy: G3P1011 at [redacted]w[redacted]d 1. Supervision of other normal pregnancy, antepartum  - Alpha fetoprotein, maternal  2. Gestational hypertension, antepartum  Continue BASA BP good today   Preterm labor symptoms and general obstetric precautions including but not limited to vaginal bleeding, contractions, leaking of fluid and fetal movement were reviewed in detail with the patient. Please refer to After Visit Summary for other counseling recommendations.   No follow-ups on file.  Future Appointments  Date Time Provider Department Center   05/18/2021  7:30 AM [facility] NURSE [facility]  05/18/2021  7:45 AM [facility] US4 [facility]  06/06/2021  8:50 AM Brand Males, CNM [facility]    Venia Carbon, NP

## 2021-05-10 LAB — ALPHA FETOPROTEIN, MATERNAL
AFP MoM: 0.87
AFP, Serum: 43.3 ng/mL
Calc'd Gestational Age: 18.9 weeks
Maternal Wt: 158 [lb_av]
Risk for ONTD: <1
Twins-AFP: 1

## 2021-05-18 ENCOUNTER — Ambulatory Visit: Payer: Medicaid Other | Admitting: *Deleted

## 2021-05-18 ENCOUNTER — Encounter: Payer: Self-pay | Admitting: *Deleted

## 2021-05-18 ENCOUNTER — Ambulatory Visit: Payer: Medicaid Other

## 2021-05-18 ENCOUNTER — Other Ambulatory Visit: Payer: Self-pay

## 2021-05-18 VITALS — BP 124/84 | HR 80

## 2021-05-18 DIAGNOSIS — Z3A20 20 weeks gestation of pregnancy: Secondary | ICD-10-CM | POA: Insufficient documentation

## 2021-05-18 DIAGNOSIS — O09292 Supervision of pregnancy with other poor reproductive or obstetric history, second trimester: Secondary | ICD-10-CM | POA: Insufficient documentation

## 2021-05-18 DIAGNOSIS — Z3480 Encounter for supervision of other normal pregnancy, unspecified trimester: Secondary | ICD-10-CM | POA: Diagnosis not present

## 2021-05-18 DIAGNOSIS — Z3A15 15 weeks gestation of pregnancy: Secondary | ICD-10-CM | POA: Diagnosis not present

## 2021-05-18 DIAGNOSIS — Z363 Encounter for antenatal screening for malformations: Secondary | ICD-10-CM | POA: Diagnosis not present

## 2021-06-06 ENCOUNTER — Ambulatory Visit (INDEPENDENT_AMBULATORY_CARE_PROVIDER_SITE_OTHER): Payer: Medicaid Other

## 2021-06-06 ENCOUNTER — Other Ambulatory Visit: Payer: Self-pay

## 2021-06-06 VITALS — BP 128/72 | HR 80 | Wt 162.0 lb

## 2021-06-06 DIAGNOSIS — Z3480 Encounter for supervision of other normal pregnancy, unspecified trimester: Secondary | ICD-10-CM

## 2021-06-06 DIAGNOSIS — Z3A22 22 weeks gestation of pregnancy: Secondary | ICD-10-CM

## 2021-06-06 DIAGNOSIS — O26899 Other specified pregnancy related conditions, unspecified trimester: Secondary | ICD-10-CM

## 2021-06-06 DIAGNOSIS — R102 Other specified pregnancy related conditions, unspecified trimester: Secondary | ICD-10-CM

## 2021-06-06 DIAGNOSIS — Z8759 Personal history of other complications of pregnancy, childbirth and the puerperium: Secondary | ICD-10-CM

## 2021-06-06 NOTE — Progress Notes (Signed)
° °  PRENATAL VISIT NOTE  Subjective:  Ariel Cardenas is a 30 y.o. G3P1011 at [redacted]w[redacted]d being seen today for ongoing prenatal care.  She is currently monitored for the following issues for this low-risk pregnancy and has SCOLIOSIS; Gestational hypertension; and Supervision of other normal pregnancy, antepartum on their problem list.  Patient reports pelvic pressure 2 weeks ago that was worse with standing or for sitting for long periods. Pain has since mostly resolved, only occurring occasionally. Denies bleeding, discharge, leaking fluid, or urinary s/s.  Contractions: Not present. Vag. Bleeding: None.  Movement: Present. Denies leaking of fluid.   The following portions of the patient's history were reviewed and updated as appropriate: allergies, current medications, past family history, past medical history, past social history, past surgical history and problem list.   Objective:   Vitals:   06/06/21 0849  BP: 128/72  Pulse: 80  Weight: 162 lb (73.5 kg)    Fetal Status: Fetal Heart Rate (bpm): 150   Movement: Present     General:  Alert, oriented and cooperative. Patient is in no acute distress.  Skin: Skin is warm and dry. No rash noted.   Cardiovascular: Normal heart rate noted  Respiratory: Normal respiratory effort, no problems with respiration noted  Abdomen: Soft, gravid, appropriate for gestational age.  Pain/Pressure: Absent     Pelvic: Cervical exam deferred        Extremities: Normal range of motion.  Edema: None  Mental Status: Normal mood and affect. Normal behavior. Normal judgment and thought content.   Assessment and Plan:  Pregnancy: G3P1011 at [redacted]w[redacted]d 1. Supervision of other normal pregnancy, antepartum - Routine OB. Doing well - Anticipatory guidance for upcoming appointment provided - GTT and 28 week labs at next visit  2. [redacted] weeks gestation of pregnancy   3. History of gestational hypertension - BP normotensive - On bASA  4. Pelvic pain affecting pregnancy,  antepartum - Reassurance provided - Encouraged adequate hydration, support belt, Tylenol, heat/ice, stretching/yoga   Preterm labor symptoms and general obstetric precautions including but not limited to vaginal bleeding, contractions, leaking of fluid and fetal movement were reviewed in detail with the patient. Please refer to After Visit Summary for other counseling recommendations.   Return in about 5 weeks (around 07/11/2021).  Future Appointments  Date Time Provider Department Center  07/11/2021  8:30 AM Leftwich-Kirby, Wilmer Floor, CNM [facility]    Brand Males, CNM 06/06/21 9:10 AM

## 2021-06-16 IMAGING — US US OB TRANSVAGINAL
1 series · 13 of 28 positions shown · non-contrast
Comparison: None.
COMPARISON: None.

Addendum:
CLINICAL DATA: Uncertain location of pregnancy

EXAM:
TRANSVAGINAL OB ULTRASOUND
TECHNIQUE: Transvaginal ultrasound was performed for complete evaluation of the
gestation as well as the maternal uterus, adnexal regions, and
pelvic cul-de-sac.

[Series 1: us ob transvaginal · 0.12mm/px · 13 of 88 slices shown]
[im 4/88]
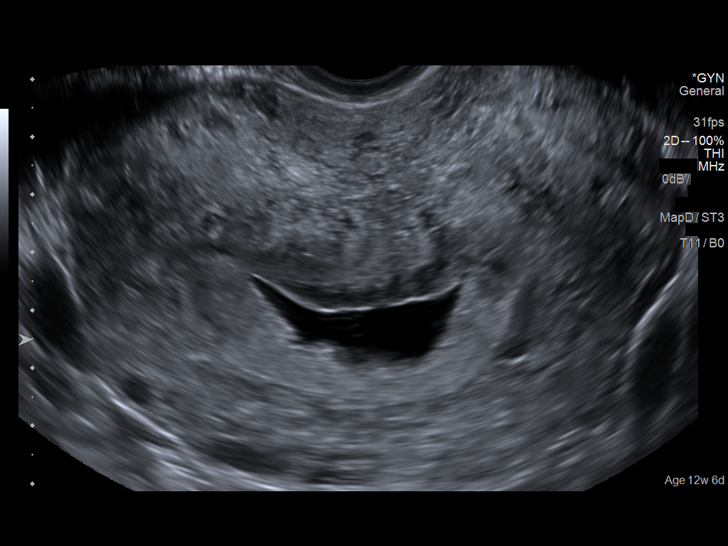
[im 10/88]
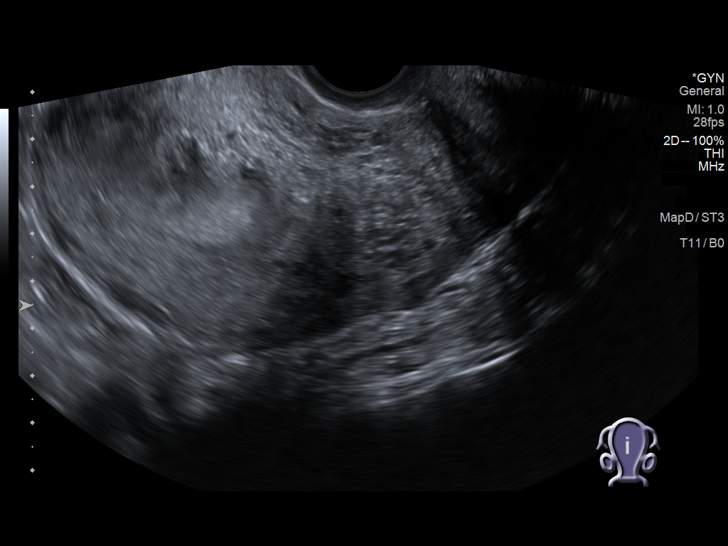
[im 17/88]
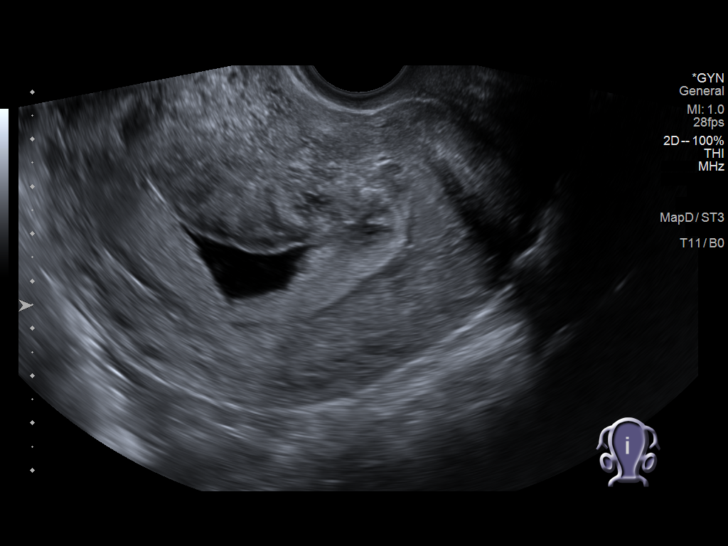
[im 23/88]
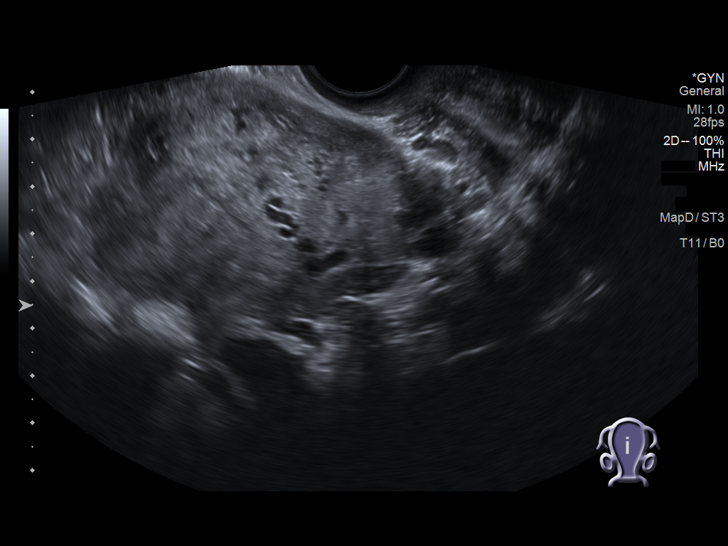
[im 30/88]
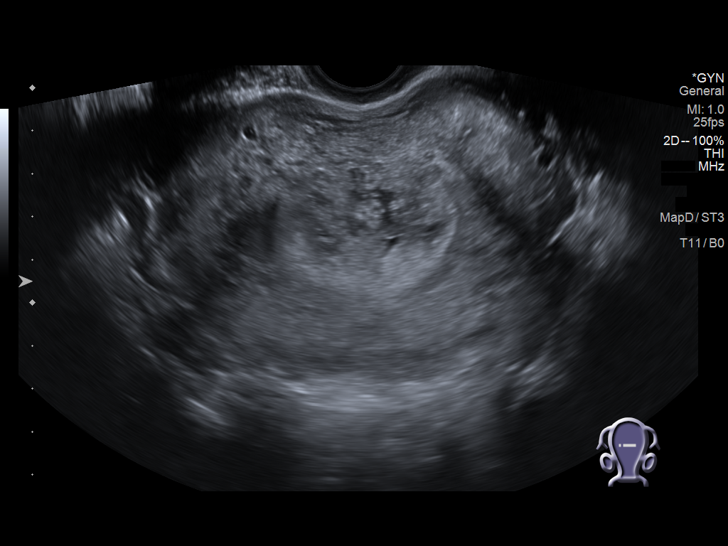
[im 36/88]
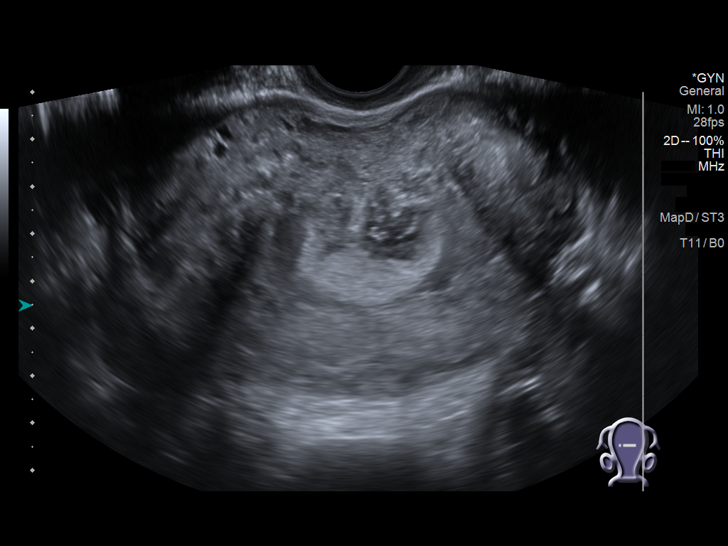
[im 46/88]
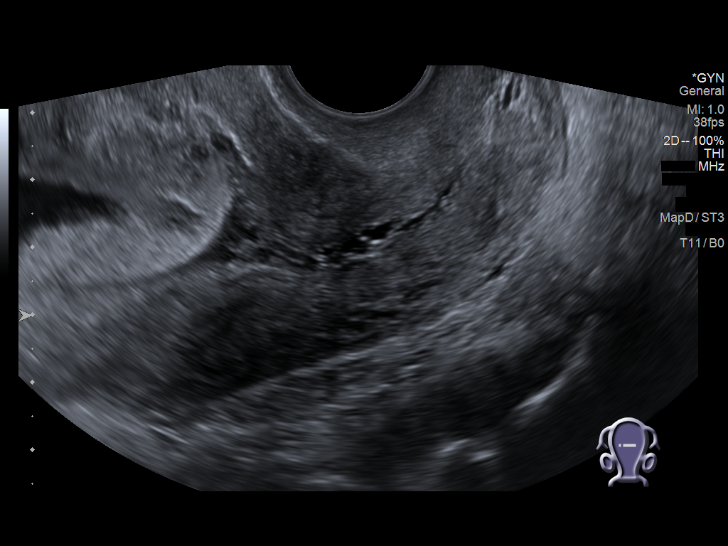
[im 52/88]
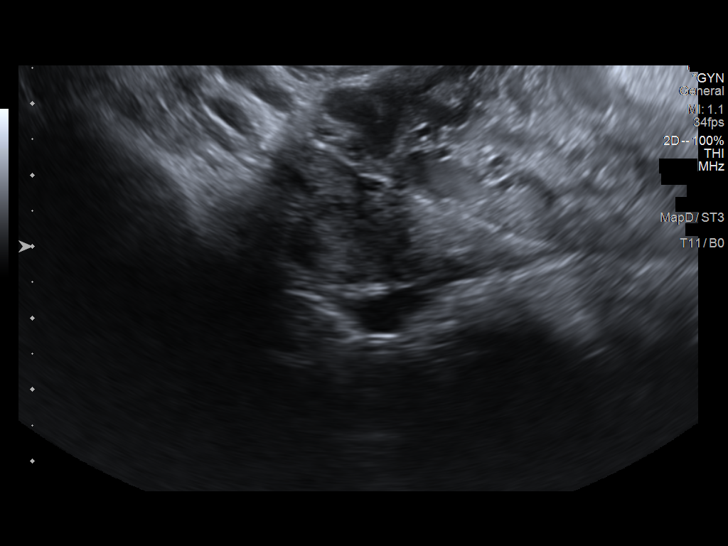
[im 59/88]
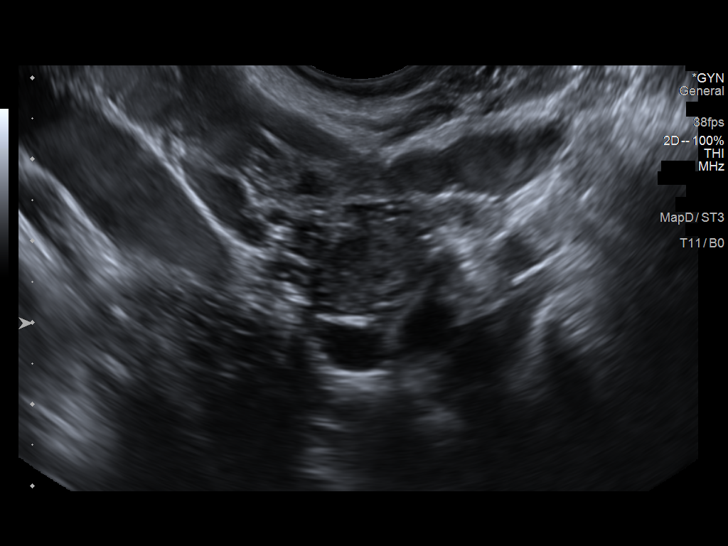
[im 65/88]
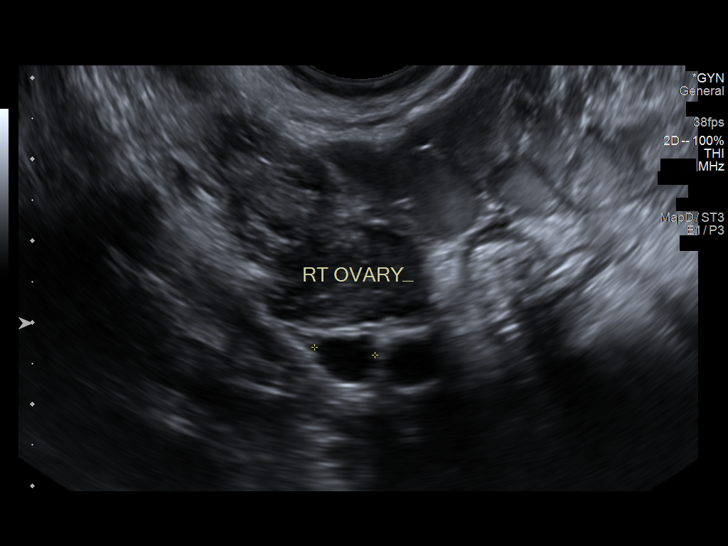
[im 71/88]
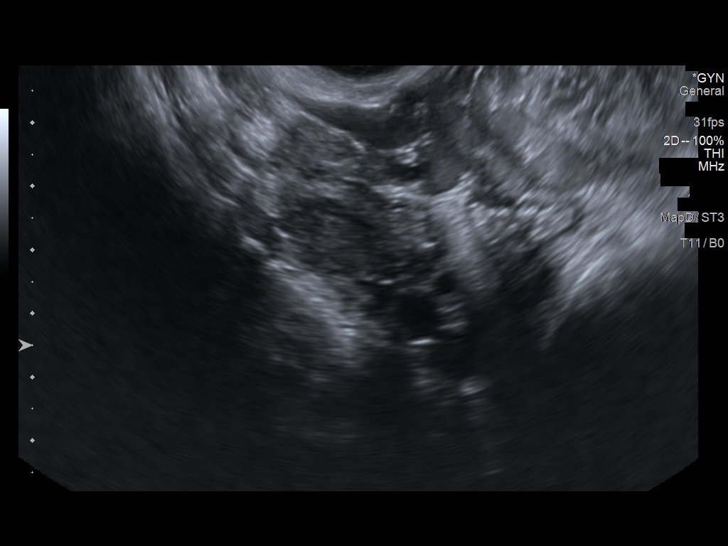
[im 78/88]
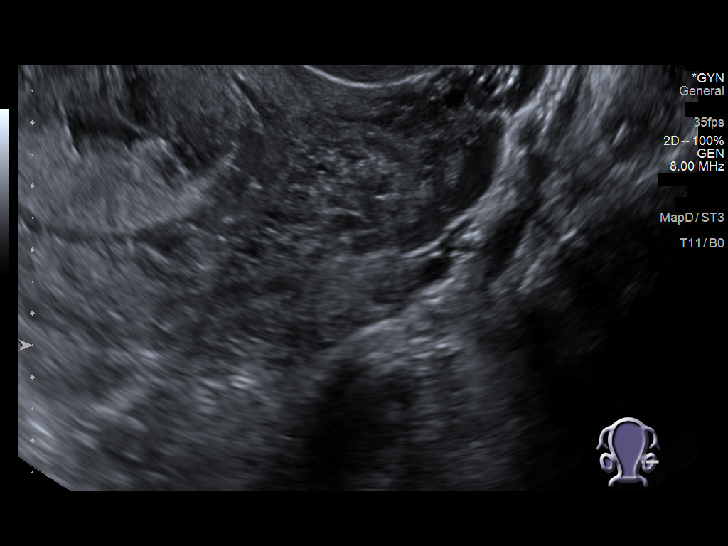
[im 84/88]
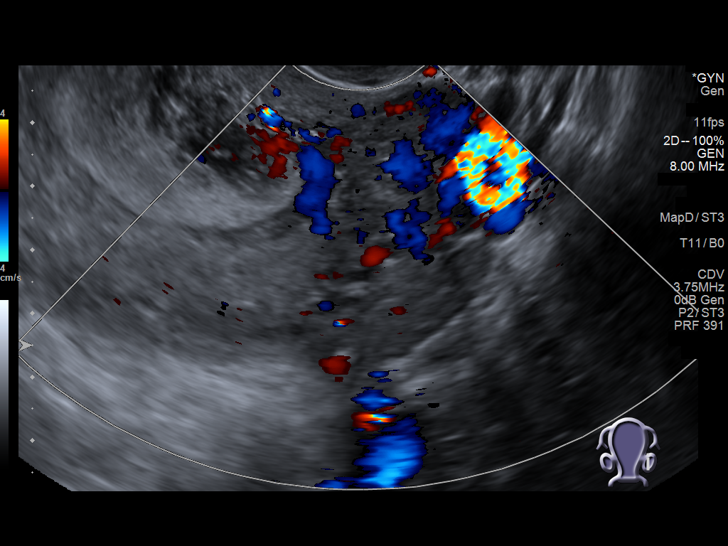

[13 of 28 positions shown; findings below may reference images not displayed]

FINDINGS: Intrauterine gestational sac: Not visualized

Yolk sac:  Not visualized

Embryo:  Not visualized

Cardiac Activity: Not visualized

Subchorionic hemorrhage:  None visualized.

Maternal uterus/adnexae: Within a thickened endometrium, there is a
fluid collection measuring 3.9 x 1.1 cm. This area does not appear
sac-like. There is mild fluid in the cervix. Right ovary measures
2.3 x 1.7 x 1.3 cm. Left ovary measures 2.4 x 1.0 x 1.2 cm. No
extrauterine pelvic mass. No appreciable free fluid.
IMPRESSION: There is no evident intrauterine gestation. There is fluid in the
endometrium as well as mild fluid in the cervix. Appearance raises
question of recent spontaneous abortion. This circumstance warrants
clinical assessment as well as beta HCG correlation. Timing of
repeat ultrasound in part will depend on beta HCG values going
forward.

ADDENDUM:
There is an image on our time labeled Dann Sato showing an apparent
intrauterine gestation from earlier today. I spoke directly to
Ramiar Tiger who informs me that this image was of a different
patient and that there is no image from earlier in the day for
current patient.

*** End of Addendum ***
FINDINGS: Intrauterine gestational sac: Not visualized

Yolk sac:  Not visualized

Embryo:  Not visualized

Cardiac Activity: Not visualized

Subchorionic hemorrhage:  None visualized.

Maternal uterus/adnexae: Within a thickened endometrium, there is a
fluid collection measuring 3.9 x 1.1 cm. This area does not appear
sac-like. There is mild fluid in the cervix. Right ovary measures
2.3 x 1.7 x 1.3 cm. Left ovary measures 2.4 x 1.0 x 1.2 cm. No
extrauterine pelvic mass. No appreciable free fluid.
IMPRESSION: There is no evident intrauterine gestation. There is fluid in the
endometrium as well as mild fluid in the cervix. Appearance raises
question of recent spontaneous abortion. This circumstance warrants
clinical assessment as well as beta HCG correlation. Timing of
repeat ultrasound in part will depend on beta HCG values going
forward.

## 2021-06-18 NOTE — L&D Delivery Note (Signed)
Delivery Note ?Pt progressed nicely to C/C/+2 w/urge to push.  After a 2 contraction 2nd stage, at 12:07 PM a viable female was delivered via Vaginal, Spontaneous (Presentation:   Occiput Anterior).  APGAR: 8, 9; weight pending. After 1 minute, the cord was clamped and cut. 40 units of pitocin diluted in 1000cc LR was infused rapidly IV.  The placenta partially separated spontaneously, brisk bleeding was noted. Manual removal d/t continued bleeding.  It was inspected and appears to be intact with a 3 VC. TXA 1gm given IV, along w/2gm Mefoxin. ? ?Anesthesia: Epidural ?Episiotomy:   ?Lacerations: 1st degree;Vaginal ?Suture Repair: 2.0 monocryl ?Est. Blood Loss (mL): 700 ? ?Mom to postpartum.  Baby to Couplet care / Skin to Skin. ? ?Jacklyn Shell ?09/28/2021, 12:57 PM ? ? ?  ?

## 2021-07-11 ENCOUNTER — Encounter: Payer: Self-pay | Admitting: Advanced Practice Midwife

## 2021-07-11 ENCOUNTER — Ambulatory Visit (INDEPENDENT_AMBULATORY_CARE_PROVIDER_SITE_OTHER): Payer: Medicaid Other | Admitting: Advanced Practice Midwife

## 2021-07-11 ENCOUNTER — Other Ambulatory Visit: Payer: Self-pay

## 2021-07-11 VITALS — BP 111/75 | HR 84 | Wt 167.0 lb

## 2021-07-11 DIAGNOSIS — Z8759 Personal history of other complications of pregnancy, childbirth and the puerperium: Secondary | ICD-10-CM

## 2021-07-11 DIAGNOSIS — O24410 Gestational diabetes mellitus in pregnancy, diet controlled: Secondary | ICD-10-CM

## 2021-07-11 DIAGNOSIS — Z3480 Encounter for supervision of other normal pregnancy, unspecified trimester: Secondary | ICD-10-CM

## 2021-07-11 DIAGNOSIS — O99013 Anemia complicating pregnancy, third trimester: Secondary | ICD-10-CM

## 2021-07-11 DIAGNOSIS — Z3A27 27 weeks gestation of pregnancy: Secondary | ICD-10-CM

## 2021-07-11 NOTE — Progress Notes (Signed)
Pt is undecided about Tdap- will let us know at next visit

## 2021-07-11 NOTE — Progress Notes (Signed)
° °  PRENATAL VISIT NOTE  Subjective:  Ariel Cardenas is a 31 y.o. G3P1011 at [redacted]w[redacted]d being seen today for ongoing prenatal care.  She is currently monitored for the following issues for this low-risk pregnancy and has SCOLIOSIS; Supervision of other normal pregnancy, antepartum; and History of gestational hypertension on their problem list.  Patient reports no complaints.  Contractions: Not present. Vag. Bleeding: None.  Movement: Present. Denies leaking of fluid.   The following portions of the patient's history were reviewed and updated as appropriate: allergies, current medications, past family history, past medical history, past social history, past surgical history and problem list.   Objective:   Vitals:   07/11/21 0838  BP: 111/75  Pulse: 84  Weight: 167 lb (75.8 kg)    Fetal Status: Fetal Heart Rate (bpm): 150 Fundal Height: 28 cm Movement: Present     General:  Alert, oriented and cooperative. Patient is in no acute distress.  Skin: Skin is warm and dry. No rash noted.   Cardiovascular: Normal heart rate noted  Respiratory: Normal respiratory effort, no problems with respiration noted  Abdomen: Soft, gravid, appropriate for gestational age.  Pain/Pressure: Absent     Pelvic: Cervical exam deferred        Extremities: Normal range of motion.  Edema: None  Mental Status: Normal mood and affect. Normal behavior. Normal judgment and thought content.   Assessment and Plan:  Pregnancy: G3P1011 at [redacted]w[redacted]d 1. History of gestational hypertension --On BASA, BP wnl this pregnancy  2. Supervision of other normal pregnancy, antepartum --Anticipatory guidance about next visits/weeks of pregnancy given. --Next appt in 2 weeks  3. [redacted] weeks gestation of pregnancy --GTT and 28 week labs today  Preterm labor symptoms and general obstetric precautions including but not limited to vaginal bleeding, contractions, leaking of fluid and fetal movement were reviewed in detail with the patient. Please  refer to After Visit Summary for other counseling recommendations.   Return in about 2 weeks (around 07/25/2021).  Future Appointments  Date Time Provider Department Center  07/25/2021  8:50 AM Leftwich-Kirby, Wilmer Floor, CNM [facility]     Sharen Counter, CNM

## 2021-07-12 ENCOUNTER — Encounter: Payer: Self-pay | Admitting: Advanced Practice Midwife

## 2021-07-12 ENCOUNTER — Other Ambulatory Visit: Payer: Self-pay

## 2021-07-12 DIAGNOSIS — B001 Herpesviral vesicular dermatitis: Secondary | ICD-10-CM

## 2021-07-12 LAB — CBC
HCT: 30.3 % — ABNORMAL LOW (ref 35.0–45.0)
Hemoglobin: 9.7 g/dL — ABNORMAL LOW (ref 11.7–15.5)
MCH: 25.7 pg — ABNORMAL LOW (ref 27.0–33.0)
MCHC: 32 g/dL (ref 32.0–36.0)
MCV: 80.2 fL (ref 80.0–100.0)
MPV: 10.3 fL (ref 7.5–12.5)
Platelets: 261 10*3/uL (ref 140–400)
RBC: 3.78 10*6/uL — ABNORMAL LOW (ref 3.80–5.10)
RDW: 14.4 % (ref 11.0–15.0)
WBC: 7.4 10*3/uL (ref 3.8–10.8)

## 2021-07-12 LAB — 2HR GTT W 1 HR, CARPENTER, 75 G
Glucose, 1 Hr, Gest: 106 mg/dL (ref 65–179)
Glucose, 2 Hr, Gest: 157 mg/dL — ABNORMAL HIGH (ref 65–152)
Glucose, Fasting, Gest: 69 mg/dL (ref 65–91)

## 2021-07-12 LAB — HIV ANTIBODY (ROUTINE TESTING W REFLEX): HIV 1&2 Ab, 4th Generation: NONREACTIVE

## 2021-07-12 LAB — RPR: RPR Ser Ql: NONREACTIVE

## 2021-07-12 MED ORDER — VALACYCLOVIR HCL 500 MG PO TABS: 2000.0000 mg | ORAL_TABLET | Freq: Two times a day (BID) | ORAL | 0 refills | Status: DC

## 2021-07-12 NOTE — Progress Notes (Signed)
Rx for Valtrex 2000 mg Q12 hours x2 doses sent per Sharen Counter, CNM

## 2021-07-13 ENCOUNTER — Encounter: Payer: Self-pay | Admitting: *Deleted

## 2021-07-13 ENCOUNTER — Telehealth: Payer: Self-pay | Admitting: *Deleted

## 2021-07-13 DIAGNOSIS — O24419 Gestational diabetes mellitus in pregnancy, unspecified control: Secondary | ICD-10-CM

## 2021-07-13 MED ORDER — BLOOD GLUCOSE MONITOR KIT
PACK | 0 refills | Status: DC
Start: 2021-07-13 — End: 2021-09-28

## 2021-07-13 NOTE — Telephone Encounter (Signed)
LM on cell phone voicemail that she does have gestational diabetes.  Referral placed for N&D and RX for Glucometer along with strips and lancets sent to her pharmacy.  Also sent pt a my chart message.

## 2021-07-14 DIAGNOSIS — O99013 Anemia complicating pregnancy, third trimester: Secondary | ICD-10-CM | POA: Insufficient documentation

## 2021-07-14 MED ORDER — FERROUS FUMARATE 150 MG PO TABS: 1.0000 | ORAL_TABLET | ORAL | 5 refills | Status: DC

## 2021-07-14 NOTE — Addendum Note (Signed)
Addended by: Sharen Counter A on: 07/14/2021 05:57 AM   Modules accepted: Orders

## 2021-07-14 NOTE — Addendum Note (Signed)
Addended by: Sharen Counter A on: 07/14/2021 05:53 AM   Modules accepted: Orders

## 2021-07-24 ENCOUNTER — Ambulatory Visit: Payer: Medicaid Other | Attending: Advanced Practice Midwife

## 2021-07-24 ENCOUNTER — Ambulatory Visit (HOSPITAL_BASED_OUTPATIENT_CLINIC_OR_DEPARTMENT_OTHER): Payer: Medicaid Other | Admitting: Obstetrics

## 2021-07-24 ENCOUNTER — Encounter: Payer: Self-pay | Admitting: *Deleted

## 2021-07-24 ENCOUNTER — Other Ambulatory Visit: Payer: Self-pay

## 2021-07-24 ENCOUNTER — Other Ambulatory Visit: Payer: Self-pay | Admitting: *Deleted

## 2021-07-24 ENCOUNTER — Ambulatory Visit: Payer: Medicaid Other | Admitting: *Deleted

## 2021-07-24 VITALS — BP 123/80 | HR 88

## 2021-07-24 DIAGNOSIS — Z3A29 29 weeks gestation of pregnancy: Secondary | ICD-10-CM

## 2021-07-24 DIAGNOSIS — O24419 Gestational diabetes mellitus in pregnancy, unspecified control: Secondary | ICD-10-CM | POA: Insufficient documentation

## 2021-07-24 DIAGNOSIS — O24410 Gestational diabetes mellitus in pregnancy, diet controlled: Secondary | ICD-10-CM

## 2021-07-24 DIAGNOSIS — O09299 Supervision of pregnancy with other poor reproductive or obstetric history, unspecified trimester: Secondary | ICD-10-CM

## 2021-07-24 DIAGNOSIS — O09293 Supervision of pregnancy with other poor reproductive or obstetric history, third trimester: Secondary | ICD-10-CM

## 2021-07-24 NOTE — Progress Notes (Signed)
MFM Note  Ariel Cardenas was seen for a follow up growth scan due to recently diagnosed diet-controlled gestational diabetes.  She is scheduled for diabetic teaching later this week.  She denies any other problems since her last exam.  She was informed that the fetal growth and amniotic fluid level appears appropriate for her gestational age.  The overall EFW obtained today was at the 13th percentile for her gestational age.  The following were discussed during our consultation today:  Gestational diabetes and pregnancy  The implications and management of diabetes in pregnancy was discussed in detail with the patient.  She was advised to monitor her fingersticks 4 times daily (fasting and 2 hours after each meal).    She was advised that our goals for her fingerstick values are fasting values of 90-95 or less and two-hour postprandial values of 120 or less.  Should the majority of her fingerstick results be above these values, she may have to be started on metformin or insulin to help her achieve better glycemic control. The patient was advised that getting her fingerstick values as close to these goals as possible would provide her with the most optimal obstetrical outcome.  Due to gestational diabetes, we will continue to follow her with monthly growth ultrasounds.    Weekly fetal testing starting at 32 weeks is recommended should she require insulin or metformin for treatment of gestational diabetes.  The increased risk of polyhydramnios, fetal macrosomia, and preeclampsia associated with diabetes was also discussed.    The patient was advised that delivery for well-controlled diabetes in pregnancy is usually recommended at around 39 weeks.  Delivery at 37 weeks may be considered should her glycemic control be poor.  The patient stated that all of her questions have been answered to her satisfaction.    A follow-up growth scan was scheduled in 3 weeks.  A total of 20 minutes was spent  counseling and coordinating the care for this patient.  Greater than 50% of the time was spent in direct face-to-face contact.

## 2021-07-25 ENCOUNTER — Ambulatory Visit (INDEPENDENT_AMBULATORY_CARE_PROVIDER_SITE_OTHER): Payer: Medicaid Other | Admitting: Advanced Practice Midwife

## 2021-07-25 VITALS — BP 114/71 | HR 89 | Wt 168.0 lb

## 2021-07-25 DIAGNOSIS — Z3480 Encounter for supervision of other normal pregnancy, unspecified trimester: Secondary | ICD-10-CM

## 2021-07-25 DIAGNOSIS — O99013 Anemia complicating pregnancy, third trimester: Secondary | ICD-10-CM | POA: Diagnosis not present

## 2021-07-25 DIAGNOSIS — Z3A29 29 weeks gestation of pregnancy: Secondary | ICD-10-CM | POA: Diagnosis not present

## 2021-07-25 DIAGNOSIS — O24410 Gestational diabetes mellitus in pregnancy, diet controlled: Secondary | ICD-10-CM | POA: Diagnosis not present

## 2021-07-25 MED ORDER — BLOOD GLUCOSE MONITOR KIT: PACK | 0 refills | Status: DC

## 2021-07-25 MED ORDER — FERROUS FUMARATE 150 MG PO TABS: 1.0000 | ORAL_TABLET | ORAL | 5 refills | Status: AC

## 2021-07-25 NOTE — Addendum Note (Signed)
Addended by: Kathie Dike on: 07/25/2021 09:28 AM   Modules accepted: Orders

## 2021-07-25 NOTE — Progress Notes (Signed)
° ° °  PRENATAL VISIT NOTE  Subjective:  Ariel Cardenas is a 31 y.o. G3P1011 at [redacted]w[redacted]d being seen today for ongoing prenatal care.  She is currently monitored for the following issues for this low-risk pregnancy and has SCOLIOSIS; Supervision of other normal pregnancy, antepartum; History of gestational hypertension; Gestational diabetes; and Anemia affecting pregnancy in third trimester on their problem list.  Patient reports no complaints.  Contractions: Not present. Vag. Bleeding: None.  Movement: Present. Denies leaking of fluid.   The following portions of the patient's history were reviewed and updated as appropriate: allergies, current medications, past family history, past medical history, past social history, past surgical history and problem list.   Objective:   Vitals:   07/25/21 0852  BP: 114/71  Pulse: 89  Weight: 168 lb (76.2 kg)    Fetal Status: Fetal Heart Rate (bpm): 149 Fundal Height: 29 cm Movement: Present     General:  Alert, oriented and cooperative. Patient is in no acute distress.  Skin: Skin is warm and dry. No rash noted.   Cardiovascular: Normal heart rate noted  Respiratory: Normal respiratory effort, no problems with respiration noted  Abdomen: Soft, gravid, appropriate for gestational age.  Pain/Pressure: Absent     Pelvic: Cervical exam deferred        Extremities: Normal range of motion.  Edema: None  Mental Status: Normal mood and affect. Normal behavior. Normal judgment and thought content.   Assessment and Plan:  Pregnancy: G3P1011 at [redacted]w[redacted]d 1. Supervision of other normal pregnancy, antepartum --Anticipatory guidance about next visits/weeks of pregnancy given.  --next visit in 2 weeks  2. Diet controlled gestational diabetes mellitus (GDM) in third trimester --Pt has not picked up meter yet, diabetes education class is tomorrow. Pt reports order was cancelled and not available at pharmacy. --Resent Rx for meter to pt pharmacy --Pt to take glucose 4 x  daily, fasting and 2 hours PP  --Bring log to next appt, enter values into [facility]  3. [redacted] weeks gestation of pregnancy   Preterm labor symptoms and general obstetric precautions including but not limited to vaginal bleeding, contractions, leaking of fluid and fetal movement were reviewed in detail with the patient. Please refer to After Visit Summary for other counseling recommendations.   Return in about 2 weeks (around 08/08/2021).  Future Appointments  Date Time Provider Department Center  07/26/2021  9:00 AM [facility] GDM CLASS [facility]  08/08/2021 10:50 AM Hurshel Party, CNM [facility]  08/15/2021  8:00 AM [facility] NURSE [facility]  08/15/2021  8:15 AM [facility] US2 [facility]    Sharen Counter, CNM

## 2021-07-26 ENCOUNTER — Other Ambulatory Visit: Payer: Self-pay

## 2021-07-26 ENCOUNTER — Encounter: Payer: Self-pay | Admitting: Registered"

## 2021-07-26 ENCOUNTER — Encounter: Payer: Medicaid Other | Attending: Advanced Practice Midwife | Admitting: Registered"

## 2021-07-26 DIAGNOSIS — O24419 Gestational diabetes mellitus in pregnancy, unspecified control: Secondary | ICD-10-CM | POA: Diagnosis not present

## 2021-07-26 NOTE — Progress Notes (Signed)
Patient was seen on 07/26/21 for Gestational Diabetes self-management class at [facility]. The following learning objectives were met by the patient during this course:  States the definition of Gestational Diabetes States why dietary management is important in controlling blood glucose Describes the effects each nutrient has on blood glucose levels Demonstrates ability to create a balanced meal plan Demonstrates carbohydrate counting  States when to check blood glucose levels Demonstrates proper blood glucose monitoring techniques States the effect of stress and exercise on blood glucose levels States the importance of limiting caffeine and abstaining from alcohol and smoking  Blood glucose monitor given: Acc[employer]-che[employer] Me Lot #[id number] Exp: 09/04/2022 CBG: 91 mg/dL   Patient instructed to monitor glucose levels: FBS: 60 - <95; 1 hour: <140; 2 hour: <120  Patient received handouts: Nutrition Diabetes and Pregnancy, including carb counting list  Patient will be seen for follow-up as needed.

## 2021-08-01 ENCOUNTER — Other Ambulatory Visit: Payer: Self-pay | Admitting: *Deleted

## 2021-08-01 MED ORDER — ACCU-CHEK GUIDE VI STRP: ORAL_STRIP | 12 refills | Status: DC

## 2021-08-01 MED ORDER — ACCU-CHEK SOFTCLIX LANCETS MISC: 12 refills | Status: DC

## 2021-08-08 ENCOUNTER — Ambulatory Visit (INDEPENDENT_AMBULATORY_CARE_PROVIDER_SITE_OTHER): Payer: Medicaid Other | Admitting: Advanced Practice Midwife

## 2021-08-08 ENCOUNTER — Other Ambulatory Visit: Payer: Self-pay

## 2021-08-08 VITALS — BP 103/67 | HR 89 | Wt 168.0 lb

## 2021-08-08 DIAGNOSIS — Z3480 Encounter for supervision of other normal pregnancy, unspecified trimester: Secondary | ICD-10-CM

## 2021-08-08 DIAGNOSIS — O24410 Gestational diabetes mellitus in pregnancy, diet controlled: Secondary | ICD-10-CM

## 2021-08-08 DIAGNOSIS — O99013 Anemia complicating pregnancy, third trimester: Secondary | ICD-10-CM

## 2021-08-08 DIAGNOSIS — B001 Herpesviral vesicular dermatitis: Secondary | ICD-10-CM

## 2021-08-08 NOTE — Progress Notes (Signed)
° °  PRENATAL VISIT NOTE  Subjective:  Ariel Cardenas is a 31 y.o. G3P1011 at [redacted]w[redacted]d being seen today for ongoing prenatal care.  She is currently monitored for the following issues for this low-risk pregnancy and has SCOLIOSIS; Supervision of other normal pregnancy, antepartum; History of gestational hypertension; Gestational diabetes; and Anemia affecting pregnancy in third trimester on their problem list.  Patient reports no complaints.  Contractions: Not present. Vag. Bleeding: None.  Movement: Present. Denies leaking of fluid.   The following portions of the patient's history were reviewed and updated as appropriate: allergies, current medications, past family history, past medical history, past social history, past surgical history and problem list.   Objective:   Vitals:   08/08/21 1051  BP: 103/67  Pulse: 89  Weight: 168 lb (76.2 kg)    Fetal Status: Fetal Heart Rate (bpm): 139 Fundal Height: 32 cm Movement: Present     General:  Alert, oriented and cooperative. Patient is in no acute distress.  Skin: Skin is warm and dry. No rash noted.   Cardiovascular: Normal heart rate noted  Respiratory: Normal respiratory effort, no problems with respiration noted  Abdomen: Soft, gravid, appropriate for gestational age.  Pain/Pressure: Absent     Pelvic: Cervical exam deferred        Extremities: Normal range of motion.  Edema: None  Mental Status: Normal mood and affect. Normal behavior. Normal judgment and thought content.   Assessment and Plan:  Pregnancy: G3P1011 at [redacted]w[redacted]d 1. Supervision of other normal pregnancy, antepartum --Anticipatory guidance about next visits/weeks of pregnancy given.  --next appt in 2 weeks  2. Diet controlled gestational diabetes mellitus (GDM) in third trimester --Reviewed glucose log.  Fastings, 5 out of 5 wnl.  PP with 3 out of 16 elevated, 2 in 150s, one at 123.  Pt reports she missed breakfast and was  busy away from home on the 2 days with 150s, so  discussed lifestyle changes.   --No change to plan of care, continue management with diet, follow up in 2 weeks --Growth [place] as scheduled  3. Anemia affecting pregnancy in third trimester --No s/sx, taking oral iron  4. Recurrent cold sores -Recent outbreak tx with Valtrex and resolved  Preterm labor symptoms and general obstetric precautions including but not limited to vaginal bleeding, contractions, leaking of fluid and fetal movement were reviewed in detail with the patient. Please refer to After Visit Summary for other counseling recommendations.   Return in about 2 weeks (around 08/22/2021).  Future Appointments  Date Time Provider Department Center  08/15/2021  8:00 AM [facility]URSE [facility]  08/15/2021  8:15 AM [facility] US2 [facility]  08/22/2021 10:50 AM Leftwich-Kirby, Wilmer Floor, CNM [facility]    Sharen Counter, CNM

## 2021-08-15 ENCOUNTER — Ambulatory Visit: Payer: Medicaid Other | Admitting: *Deleted

## 2021-08-15 ENCOUNTER — Encounter: Payer: Self-pay | Admitting: *Deleted

## 2021-08-15 ENCOUNTER — Ambulatory Visit: Payer: Medicaid Other | Attending: Obstetrics

## 2021-08-15 ENCOUNTER — Other Ambulatory Visit: Payer: Self-pay | Admitting: *Deleted

## 2021-08-15 ENCOUNTER — Other Ambulatory Visit: Payer: Self-pay

## 2021-08-15 VITALS — BP 114/72 | HR 84

## 2021-08-15 DIAGNOSIS — O09293 Supervision of pregnancy with other poor reproductive or obstetric history, third trimester: Secondary | ICD-10-CM

## 2021-08-15 DIAGNOSIS — O24419 Gestational diabetes mellitus in pregnancy, unspecified control: Secondary | ICD-10-CM

## 2021-08-15 DIAGNOSIS — Z3A32 32 weeks gestation of pregnancy: Secondary | ICD-10-CM | POA: Diagnosis not present

## 2021-08-15 DIAGNOSIS — O24410 Gestational diabetes mellitus in pregnancy, diet controlled: Secondary | ICD-10-CM

## 2021-08-15 DIAGNOSIS — Z3480 Encounter for supervision of other normal pregnancy, unspecified trimester: Secondary | ICD-10-CM | POA: Insufficient documentation

## 2021-08-15 DIAGNOSIS — D649 Anemia, unspecified: Secondary | ICD-10-CM

## 2021-08-15 DIAGNOSIS — O09299 Supervision of pregnancy with other poor reproductive or obstetric history, unspecified trimester: Secondary | ICD-10-CM

## 2021-08-15 DIAGNOSIS — Z8759 Personal history of other complications of pregnancy, childbirth and the puerperium: Secondary | ICD-10-CM

## 2021-08-15 DIAGNOSIS — O99013 Anemia complicating pregnancy, third trimester: Secondary | ICD-10-CM

## 2021-08-22 ENCOUNTER — Ambulatory Visit (INDEPENDENT_AMBULATORY_CARE_PROVIDER_SITE_OTHER): Payer: Medicaid Other | Admitting: Advanced Practice Midwife

## 2021-08-22 ENCOUNTER — Other Ambulatory Visit: Payer: Self-pay

## 2021-08-22 VITALS — BP 125/83 | HR 87 | Wt 173.0 lb

## 2021-08-22 DIAGNOSIS — Z3A33 33 weeks gestation of pregnancy: Secondary | ICD-10-CM

## 2021-08-22 DIAGNOSIS — O24410 Gestational diabetes mellitus in pregnancy, diet controlled: Secondary | ICD-10-CM

## 2021-08-22 DIAGNOSIS — O99013 Anemia complicating pregnancy, third trimester: Secondary | ICD-10-CM

## 2021-08-22 DIAGNOSIS — Z3480 Encounter for supervision of other normal pregnancy, unspecified trimester: Secondary | ICD-10-CM

## 2021-08-22 NOTE — Progress Notes (Signed)
? ?  PRENATAL VISIT NOTE ? ?Subjective:  ?Ariel Cardenas is a 31 y.o. G3P1011 at [redacted]w[redacted]d being seen today for ongoing prenatal care.  She is currently monitored for the following issues for this low-risk pregnancy and has SCOLIOSIS; Supervision of other normal pregnancy, antepartum; History of gestational hypertension; Gestational diabetes; and Anemia affecting pregnancy in third trimester on their problem list. ? ?Patient reports no complaints.  Contractions: Not present. Vag. Bleeding: None.  Movement: Present. Denies leaking of fluid.  ? ?The following portions of the patient's history were reviewed and updated as appropriate: allergies, current medications, past family history, past medical history, past social history, past surgical history and problem list.  ? ?Objective:  ? ?Vitals:  ? 08/22/21 1059  ?BP: 125/83  ?Pulse: 87  ?Weight: 173 lb (78.5 kg)  ? ? ?Fetal Status: Fetal Heart Rate (bpm): 139   Movement: Present    ? ?General:  Alert, oriented and cooperative. Patient is in no acute distress.  ?Skin: Skin is warm and dry. No rash noted.   ?Cardiovascular: Normal heart rate noted  ?Respiratory: Normal respiratory effort, no problems with respiration noted  ?Abdomen: Soft, gravid, appropriate for gestational age.  Pain/Pressure: Absent     ?Pelvic: Cervical exam deferred        ?Extremities: Normal range of motion.  Edema: None  ?Mental Status: Normal mood and affect. Normal behavior. Normal judgment and thought content.  ? ?Assessment and Plan:  ?Pregnancy: G3P1011 at [redacted]w[redacted]d ?1. Supervision of other normal pregnancy, antepartum ?--Anticipatory guidance about next visits/weeks of pregnancy given.  ?--Next visit in 2 weeks for GBS ? ?2. Diet controlled gestational diabetes mellitus (GDM) in third trimester ?--Reviewed glucose log. One out of 12 fasting values elevated at 101.  All PP values out of 38 are within range.   ?--Continue current course with diet control ?--Keep scheduled growth [place] with MFM ? ?3. Anemia  affecting pregnancy in third trimester ?--Pt having trouble with pharmacy filling Rx for iron ?--Recommend OTC iron supplement every other day with food ? ?4. [redacted] weeks gestation of pregnancy ? ? ?Preterm labor symptoms and general obstetric precautions including but not limited to vaginal bleeding, contractions, leaking of fluid and fetal movement were reviewed in detail with the patient. ?Please refer to After Visit Summary for other counseling recommendations.  ? ?Return in about 2 weeks (around 09/05/2021). ? ?Future Appointments  ?Date Time Provider Department Center  ?09/05/2021 10:50 AM Leftwich-Kirby, Wilmer Floor, CNM [facility]  ?09/11/2021  7:45 AM [facility] NURSE [facility]  ?09/11/2021  8:00 AM [facility] US1 [facility]  ? ? ?Sharen Counter, CNM  ?

## 2021-09-05 ENCOUNTER — Other Ambulatory Visit (HOSPITAL_COMMUNITY)
Admission: RE | Admit: 2021-09-05 | Discharge: 2021-09-05 | Disposition: A | Discharge status: Home / Self Care | Payer: Medicaid Other | Source: Ambulatory Visit | Attending: Advanced Practice Midwife | Admitting: Advanced Practice Midwife

## 2021-09-05 ENCOUNTER — Other Ambulatory Visit: Payer: Self-pay

## 2021-09-05 ENCOUNTER — Ambulatory Visit (INDEPENDENT_AMBULATORY_CARE_PROVIDER_SITE_OTHER): Payer: Medicaid Other | Admitting: Advanced Practice Midwife

## 2021-09-05 VITALS — BP 119/78 | HR 99 | Wt 174.0 lb

## 2021-09-05 DIAGNOSIS — Z3480 Encounter for supervision of other normal pregnancy, unspecified trimester: Secondary | ICD-10-CM

## 2021-09-05 DIAGNOSIS — O24410 Gestational diabetes mellitus in pregnancy, diet controlled: Secondary | ICD-10-CM

## 2021-09-05 DIAGNOSIS — O2686 Pruritic urticarial papules and plaques of pregnancy (PUPPP): Secondary | ICD-10-CM

## 2021-09-05 DIAGNOSIS — L299 Pruritus, unspecified: Secondary | ICD-10-CM

## 2021-09-05 LAB — OB RESULTS CONSOLE GBS: GBS: NEGATIVE

## 2021-09-05 LAB — OB RESULTS CONSOLE GC/CHLAMYDIA: Gonorrhea: NEGATIVE

## 2021-09-05 MED ORDER — BETAMETHASONE VALERATE 0.1 % EX CREA: TOPICAL_CREAM | Freq: Two times a day (BID) | CUTANEOUS | 0 refills | Status: DC

## 2021-09-05 MED ORDER — HYDROXYZINE PAMOATE 25 MG PO CAPS: 25.0000 mg | ORAL_CAPSULE | Freq: Three times a day (TID) | ORAL | 0 refills | Status: DC | PRN

## 2021-09-05 NOTE — Progress Notes (Signed)
? ?  PRENATAL VISIT NOTE ? ?Subjective:  ?Ariel Cardenas is a 31 y.o. G3P1011 at [redacted]w[redacted]d being seen today for ongoing prenatal care.  She is currently monitored for the following issues for this low-risk pregnancy and has SCOLIOSIS; Supervision of other normal pregnancy, antepartum; History of gestational hypertension; Gestational diabetes; and Anemia affecting pregnancy in third trimester on their problem list. ? ?Patient reports  itching on abdomen and leg .  Contractions: Not present. Vag. Bleeding: None.  Movement: Present. Denies leaking of fluid.  ? ?The following portions of the patient's history were reviewed and updated as appropriate: allergies, current medications, past family history, past medical history, past social history, past surgical history and problem list.  ? ?Objective:  ? ?Vitals:  ? 09/05/21 1052  ?BP: 119/78  ?Pulse: 99  ?Weight: 174 lb (78.9 kg)  ? ? ?Fetal Status: Fetal Heart Rate (bpm): 145 Fundal Height: 35 cm Movement: Present    ? ?General:  Alert, oriented and cooperative. Patient is in no acute distress.  ?Skin: Skin is warm and dry. No rash noted.   ?Cardiovascular: Normal heart rate noted  ?Respiratory: Normal respiratory effort, no problems with respiration noted  ?Abdomen: Soft, gravid, appropriate for gestational age.  Pain/Pressure: Absent     ?Pelvic: Cervical exam deferred        ?Extremities: Normal range of motion.  Edema: None  ?Mental Status: Normal mood and affect. Normal behavior. Normal judgment and thought content.  ? ?Assessment and Plan:  ?Pregnancy: G3P1011 at [redacted]w[redacted]d ?1. Supervision of other normal pregnancy, antepartum ?--Anticipatory guidance about next visits/weeks of pregnancy given.  ? ?- Culture, beta strep (group b only) ?- Cervicovaginal ancillary only( [place]) ?- Bile acids, total ? ?2. Pruritus ?--Itching with striae on abdomen, but also itching on ankles without visible rash.  Will test for cholestasis but low suspicion.  Pt sent to off site [facility] for  lab. ?- Bile acids, total ? ?3. PUPP (pruritic urticarial papules and plaques of pregnancy) ? ?- hydrOXYzine (VISTARIL) 25 MG capsule; Take 1 capsule (25 mg total) by mouth 3 (three) times daily as needed.  Dispense: 30 capsule; Refill: 0 ?- betamethasone valerate (VALISONE) 0.1 % cream; Apply topically 2 (two) times daily.  Dispense: 30 g; Refill: 0 ? ?4. Diet controlled gestational diabetes mellitus (GDM) in third trimester ?--Reviewed glucose log. Fasting glucose values all wnl x 14.  Postprandial 1 out of 40 elevated.   ?--Continue current care with diet ? ?Preterm labor symptoms and general obstetric precautions including but not limited to vaginal bleeding, contractions, leaking of fluid and fetal movement were reviewed in detail with the patient. ?Please refer to After Visit Summary for other counseling recommendations.  ? ?Return in about 2 weeks (around 09/19/2021) for LOB, Any provider. ? ?Future Appointments  ?Date Time Provider [place]  ?09/11/2021  7:45 AM [facility] NURSE [facility]  ?09/11/2021  8:00 AM [facility] US1 [facility]  ?09/19/2021  9:30 AM Leftwich-Kirby, Kathie Dike, CNM [facility]  ? ? ?Fatima Blank, CNM  ?

## 2021-09-06 LAB — CERVICOVAGINAL ANCILLARY ONLY
Chlamydia: NEGATIVE
Comment: NEGATIVE
Comment: NORMAL
Neisseria Gonorrhea: NEGATIVE

## 2021-09-08 LAB — CULTURE, BETA STREP (GROUP B ONLY)
MICRO NUMBER:: 13158629
SPECIMEN QUALITY:: ADEQUATE

## 2021-09-08 LAB — BILE ACIDS, TOTAL: Bile Acids Total: 3.4 umol/L (ref 0.0–10.0)

## 2021-09-11 ENCOUNTER — Ambulatory Visit: Payer: Medicaid Other | Admitting: *Deleted

## 2021-09-11 ENCOUNTER — Ambulatory Visit: Payer: Medicaid Other | Attending: Maternal & Fetal Medicine

## 2021-09-11 ENCOUNTER — Other Ambulatory Visit: Payer: Self-pay

## 2021-09-11 VITALS — BP 115/69 | HR 86

## 2021-09-11 DIAGNOSIS — Z8759 Personal history of other complications of pregnancy, childbirth and the puerperium: Secondary | ICD-10-CM

## 2021-09-11 DIAGNOSIS — O99013 Anemia complicating pregnancy, third trimester: Secondary | ICD-10-CM

## 2021-09-11 DIAGNOSIS — O24410 Gestational diabetes mellitus in pregnancy, diet controlled: Secondary | ICD-10-CM

## 2021-09-11 DIAGNOSIS — D649 Anemia, unspecified: Secondary | ICD-10-CM

## 2021-09-11 DIAGNOSIS — O24419 Gestational diabetes mellitus in pregnancy, unspecified control: Secondary | ICD-10-CM

## 2021-09-11 DIAGNOSIS — O09293 Supervision of pregnancy with other poor reproductive or obstetric history, third trimester: Secondary | ICD-10-CM | POA: Diagnosis not present

## 2021-09-11 DIAGNOSIS — Z3480 Encounter for supervision of other normal pregnancy, unspecified trimester: Secondary | ICD-10-CM | POA: Insufficient documentation

## 2021-09-11 DIAGNOSIS — Z3A36 36 weeks gestation of pregnancy: Secondary | ICD-10-CM

## 2021-09-19 ENCOUNTER — Ambulatory Visit (INDEPENDENT_AMBULATORY_CARE_PROVIDER_SITE_OTHER): Payer: Medicaid Other | Admitting: Advanced Practice Midwife

## 2021-09-19 ENCOUNTER — Encounter: Payer: Self-pay | Admitting: Advanced Practice Midwife

## 2021-09-19 VITALS — BP 126/81 | HR 90 | Wt 175.0 lb

## 2021-09-19 DIAGNOSIS — Z3A37 37 weeks gestation of pregnancy: Secondary | ICD-10-CM

## 2021-09-19 DIAGNOSIS — O2686 Pruritic urticarial papules and plaques of pregnancy (PUPPP): Secondary | ICD-10-CM

## 2021-09-19 DIAGNOSIS — O24410 Gestational diabetes mellitus in pregnancy, diet controlled: Secondary | ICD-10-CM

## 2021-09-19 DIAGNOSIS — Z3480 Encounter for supervision of other normal pregnancy, unspecified trimester: Secondary | ICD-10-CM

## 2021-09-19 NOTE — Progress Notes (Signed)
? ?  PRENATAL VISIT NOTE ? ?Subjective:  ?Ariel Cardenas is a 31 y.o. G3P1011 at [redacted]w[redacted]d being seen today for ongoing prenatal care.  She is currently monitored for the following issues for this low-risk pregnancy and has SCOLIOSIS; Supervision of other normal pregnancy, antepartum; History of gestational hypertension; Gestational diabetes; and Anemia affecting pregnancy in third trimester on their problem list. ? ?Patient reports no complaints.  Contractions: Not present. Vag. Bleeding: None.  Movement: Present. Denies leaking of fluid.  ? ?The following portions of the patient's history were reviewed and updated as appropriate: allergies, current medications, past family history, past medical history, past social history, past surgical history and problem list.  ? ?Objective:  ? ?Vitals:  ? 09/19/21 0926  ?BP: 126/81  ?Pulse: 90  ?Weight: 175 lb (79.4 kg)  ? ? ?Fetal Status: Fetal Heart Rate (bpm): 141 Fundal Height: 37 cm Movement: Present    ? ?General:  Alert, oriented and cooperative. Patient is in no acute distress.  ?Skin: Skin is warm and dry. No rash noted.   ?Cardiovascular: Normal heart rate noted  ?Respiratory: Normal respiratory effort, no problems with respiration noted  ?Abdomen: Soft, gravid, appropriate for gestational age.  Pain/Pressure: Absent     ?Pelvic: Cervical exam deferred        ?Extremities: Normal range of motion.  Edema: None  ?Mental Status: Normal mood and affect. Normal behavior. Normal judgment and thought content.  ? ?Assessment and Plan:  ?Pregnancy: G3P1011 at [redacted]w[redacted]d ?1. Supervision of other normal pregnancy, antepartum ?--Anticipatory guidance about next visits/weeks of pregnancy given.  ?--Reviewed labor readiness with patient including the [facility] [employer], evening primrose oil, and raspberry leaf tea.   ? ?2. PUPP (pruritic urticarial papules and plaques of pregnancy) ?--Itching improved with topical medications ? ?3. Diet controlled gestational diabetes mellitus (GDM) in third  trimester ?--Reviewed glucose log, all fasting glucose values wnl, 2 out of 18 postprandial values elevated at 121 and 130. ?--Continue current course ?--IOL at 39 weeks, 4/12 or 4/13, request scanned to [facility] ?--Orders placed for Cytotec ? ?4. [redacted] weeks gestation of pregnancy ? ? ?Term labor symptoms and general obstetric precautions including but not limited to vaginal bleeding, contractions, leaking of fluid and fetal movement were reviewed in detail with the patient. ?Please refer to After Visit Summary for other counseling recommendations.  ? ?Return for IOL scheduled, needs PP appt in 6 weeks. ? ?Future Appointments  ?Date Time Provider Department Center  ?10/31/2021  8:30 AM Brand Males, CNM [facility]  ? ? ?Sharen Counter, CNM  ?

## 2021-09-20 ENCOUNTER — Other Ambulatory Visit: Payer: Self-pay | Admitting: Advanced Practice Midwife

## 2021-09-21 ENCOUNTER — Encounter (HOSPITAL_COMMUNITY): Payer: Self-pay | Admitting: *Deleted

## 2021-09-21 ENCOUNTER — Telehealth (HOSPITAL_COMMUNITY): Payer: Self-pay | Admitting: *Deleted

## 2021-09-21 NOTE — Telephone Encounter (Signed)
Preadmission screen  

## 2021-09-27 ENCOUNTER — Inpatient Hospital Stay (HOSPITAL_COMMUNITY)
Admission: AD | Admit: 2021-09-27 | Discharge: 2021-09-29 | DRG: 805 | Disposition: A | Discharge status: Home / Self Care | Payer: Medicaid Other | Attending: Family Medicine | Admitting: Family Medicine

## 2021-09-27 ENCOUNTER — Inpatient Hospital Stay (HOSPITAL_COMMUNITY): Payer: Medicaid Other

## 2021-09-27 ENCOUNTER — Encounter (HOSPITAL_COMMUNITY): Payer: Self-pay | Admitting: Family Medicine

## 2021-09-27 ENCOUNTER — Other Ambulatory Visit: Payer: Self-pay

## 2021-09-27 DIAGNOSIS — O9902 Anemia complicating childbirth: Secondary | ICD-10-CM | POA: Diagnosis present

## 2021-09-27 DIAGNOSIS — Z3A39 39 weeks gestation of pregnancy: Secondary | ICD-10-CM | POA: Diagnosis not present

## 2021-09-27 DIAGNOSIS — Z23 Encounter for immunization: Secondary | ICD-10-CM

## 2021-09-27 DIAGNOSIS — Z7982 Long term (current) use of aspirin: Secondary | ICD-10-CM | POA: Diagnosis not present

## 2021-09-27 DIAGNOSIS — O24420 Gestational diabetes mellitus in childbirth, diet controlled: Secondary | ICD-10-CM | POA: Diagnosis present

## 2021-09-27 DIAGNOSIS — O4593 Premature separation of placenta, unspecified, third trimester: Secondary | ICD-10-CM | POA: Diagnosis present

## 2021-09-27 DIAGNOSIS — O24410 Gestational diabetes mellitus in pregnancy, diet controlled: Secondary | ICD-10-CM | POA: Diagnosis present

## 2021-09-27 LAB — TYPE AND SCREEN
ABO/RH(D): O POS
Antibody Screen: NEGATIVE

## 2021-09-27 LAB — CBC
HCT: 31.7 % — ABNORMAL LOW (ref 36.0–46.0)
Hemoglobin: 9.9 g/dL — ABNORMAL LOW (ref 12.0–15.0)
MCH: 24.4 pg — ABNORMAL LOW (ref 26.0–34.0)
MCHC: 31.2 g/dL (ref 30.0–36.0)
MCV: 78.3 fL — ABNORMAL LOW (ref 80.0–100.0)
Platelets: 275 K/uL (ref 150–400)
RBC: 4.05 MIL/uL (ref 3.87–5.11)
RDW: 14 % (ref 11.5–15.5)
WBC: 6.3 K/uL (ref 4.0–10.5)
nRBC: 0 % (ref 0.0–0.2)

## 2021-09-27 LAB — GLUCOSE, CAPILLARY
Glucose-Capillary: 103 mg/dL — ABNORMAL HIGH (ref 70–99)
Glucose-Capillary: 83 mg/dL (ref 70–99)

## 2021-09-27 MED ORDER — OXYTOCIN BOLUS FROM INFUSION
333.0000 mL | Freq: Once | INTRAVENOUS | Status: AC
  Administered 2021-09-28: 333 mL via INTRAVENOUS

## 2021-09-27 MED ORDER — DIPHENHYDRAMINE HCL 50 MG/ML IJ SOLN: 12.5000 mg | INTRAMUSCULAR | Status: DC | PRN

## 2021-09-27 MED ORDER — LACTATED RINGERS IV SOLN
500.0000 mL | Freq: Once | INTRAVENOUS | Status: AC
  Administered 2021-09-28: 500 mL via INTRAVENOUS

## 2021-09-27 MED ORDER — MISOPROSTOL 50MCG HALF TABLET
50.0000 ug | ORAL_TABLET | ORAL | Status: DC
  Administered 2021-09-27: 50 ug via ORAL
  Filled 2021-09-27: qty 1

## 2021-09-27 MED ORDER — LIDOCAINE HCL (PF) 1 % IJ SOLN: 30.0000 mL | INTRAMUSCULAR | Status: DC | PRN

## 2021-09-27 MED ORDER — EPHEDRINE 5 MG/ML INJ: 10.0000 mg | INTRAVENOUS | Status: DC | PRN

## 2021-09-27 MED ORDER — OXYTOCIN-SODIUM CHLORIDE 30-0.9 UT/500ML-% IV SOLN
2.5000 [IU]/h | INTRAVENOUS | Status: DC
Start: 2021-09-27 — End: 2021-09-28
  Filled 2021-09-27: qty 500

## 2021-09-27 MED ORDER — FENTANYL CITRATE (PF) 100 MCG/2ML IJ SOLN
INTRAMUSCULAR | Status: AC
  Filled 2021-09-27: qty 2

## 2021-09-27 MED ORDER — MISOPROSTOL 50MCG HALF TABLET: 50.0000 ug | ORAL_TABLET | ORAL | Status: DC | PRN

## 2021-09-27 MED ORDER — OXYCODONE-ACETAMINOPHEN 5-325 MG PO TABS: 2.0000 | ORAL_TABLET | ORAL | Status: DC | PRN

## 2021-09-27 MED ORDER — TERBUTALINE SULFATE 1 MG/ML IJ SOLN: 0.2500 mg | Freq: Once | INTRAMUSCULAR | Status: DC | PRN

## 2021-09-27 MED ORDER — LACTATED RINGERS IV SOLN: 500.0000 mL | INTRAVENOUS | Status: DC | PRN

## 2021-09-27 MED ORDER — SOD CITRATE-CITRIC ACID 500-334 MG/5ML PO SOLN: 30.0000 mL | ORAL | Status: DC | PRN

## 2021-09-27 MED ORDER — FENTANYL CITRATE (PF) 100 MCG/2ML IJ SOLN
100.0000 ug | INTRAMUSCULAR | Status: DC | PRN
  Administered 2021-09-27 – 2021-09-28 (×2): 100 ug via INTRAVENOUS
  Filled 2021-09-27: qty 2

## 2021-09-27 MED ORDER — ONDANSETRON HCL 4 MG/2ML IJ SOLN: 4.0000 mg | Freq: Four times a day (QID) | INTRAMUSCULAR | Status: DC | PRN

## 2021-09-27 MED ORDER — FENTANYL-BUPIVACAINE-NACL 0.5-0.125-0.9 MG/250ML-% EP SOLN
12.0000 mL/h | EPIDURAL | Status: DC | PRN
  Filled 2021-09-27: qty 250

## 2021-09-27 MED ORDER — PHENYLEPHRINE 40 MCG/ML (10ML) SYRINGE FOR IV PUSH (FOR BLOOD PRESSURE SUPPORT): 80.0000 ug | PREFILLED_SYRINGE | INTRAVENOUS | Status: DC | PRN

## 2021-09-27 MED ORDER — OXYCODONE-ACETAMINOPHEN 5-325 MG PO TABS: 1.0000 | ORAL_TABLET | ORAL | Status: DC | PRN

## 2021-09-27 MED ORDER — LACTATED RINGERS IV SOLN: INTRAVENOUS | Status: DC

## 2021-09-27 MED ORDER — ACETAMINOPHEN 325 MG PO TABS
650.0000 mg | ORAL_TABLET | ORAL | Status: DC | PRN
Start: 2021-09-27 — End: 2021-09-28

## 2021-09-27 NOTE — Anesthesia Preprocedure Evaluation (Addendum)
Anesthesia Evaluation  ?Patient identified by MRN, date of birth, ID band ?Patient awake ? ? ? ?Reviewed: ?Allergy & Precautions, NPO status , Patient's Chart, lab work & pertinent test results ? ?Airway ?Mallampati: II ? ?TM Distance: >3 FB ?Neck ROM: Full ? ? ? Dental ?no notable dental hx. ?(+) Teeth Intact, Dental Advisory Given ?  ?Pulmonary ?neg pulmonary ROS,  ?  ?Pulmonary exam normal ?breath sounds clear to auscultation ? ? ? ? ? ? Cardiovascular ?Exercise Tolerance: Good ?Normal cardiovascular exam ?Rhythm:Regular Rate:Normal ? ? ?  ?Neuro/Psych ?negative neurological ROS ? negative psych ROS  ? GI/Hepatic ?negative GI ROS, Neg liver ROS,   ?Endo/Other  ?diabetes ? Renal/GU ?negative Renal ROS  ? ?  ?Musculoskeletal ? ? Abdominal ?  ?Peds ? Hematology ? ?(+) Blood dyscrasia, anemia , Lab Results ?     Component                Value               Date                 ?     WBC                      6.3                 09/27/2021           ?     HGB                      9.9 (L)             09/27/2021           ?     HCT                      31.7 (L)            09/27/2021           ?     MCV                      78.3 (L)            09/27/2021           ?     PLT                      275                 09/27/2021           ?   ?Anesthesia Other Findings ? ? Reproductive/Obstetrics ?(+) Pregnancy ? ?  ? ? ? ? ? ? ? ? ? ? ? ? ? ?  ?  ? ? ? ? ? ? ? ?Anesthesia Physical ?Anesthesia Plan ? ?ASA: 3 ? ?Anesthesia Plan: Epidural  ? ?Post-op Pain Management:   ? ?Induction:  ? ?PONV Risk Score and Plan:  ? ?Airway Management Planned:  ? ?Additional Equipment:  ? ?Intra-op Plan:  ? ?Post-operative Plan:  ? ?Informed Consent: I have reviewed the patients History and Physical, chart, labs and discussed the procedure including the risks, benefits and alternatives for the proposed anesthesia with the patient or authorized representative who has indicated his/her understanding and  acceptance.  ? ? ? ? ? ?Plan Discussed with:  ? ?Anesthesia Plan Comments: (39.1 wk G3P1  w gDm for LEA)  ? ? ? ? ? ?Anesthesia Quick Evaluation ? ?

## 2021-09-27 NOTE — Progress Notes (Signed)
Patient ID: Cielle Zoss, female   DOB: July 16, 1990, 31 y.o.   MRN: [id number] ? ? ?Zyara Nienhaus is a 31 y.o. G3P1011 at [redacted]w[redacted]d  admitted for IOL for GDMA1 ? ?Subjective: ?Coping well, ambulating in room with Adriana Simas still in place ? ?Objective: ?Vitals:  ? 09/27/21 1612 09/27/21 1744 09/27/21 1847  ?BP: 124/88 129/89 117/76  ?Pulse: 97 90 92  ?Resp: 16    ?Temp: 98.2 ?F (36.8 ?C)    ?TempSrc: Oral    ?Weight: 81.6 kg    ?Height: 5\' 1"  (1.549 m)    ? ?No intake/output data recorded. ? ?FHT:  FHR: 140 bpm, variability: moderate,  accelerations:  Present,  decelerations:  Absent ?UC:   none ?SVE:   Dilation: 2 ?Effacement (%): 50 ?Station: -3 ?Exam by:: [email or web address] CNM ?Pitocin @  mu/min ? ?Labs: ?Lab Results  ?Component Value Date  ? WBC 6.3 09/27/2021  ? HGB 9.9 (L) 09/27/2021  ? HCT 31.7 (L) 09/27/2021  ? MCV 78.3 (L) 09/27/2021  ? PLT 275 09/27/2021  ? ? ?Assessment / Plan: ?Early labor, s/p 1 dose of cytotec and will plan for next dose as scheduled ? ?Labor:  early labor ?Fetal Wellbeing:  Category I ?Pain Control:  Labor support without medications ?Anticipated MOD:  NSVD ? ?11/27/2021 Kymia Simi ?09/27/2021, 7:40 PM ? ? ? ? ?

## 2021-09-27 NOTE — Progress Notes (Signed)
Patient ID: Ariel Cardenas, female   DOB: 21-Jun-1990, 31 y.o.   MRN: [id number] ?Requesting pain med ? ?Vitals:  ? 09/27/21 1744 09/27/21 1847 09/27/21 1947 09/27/21 2053  ?BP: 129/89 117/76 130/82 128/87  ?Pulse: 90 92 86 88  ?Resp:   16   ?Temp:      ?TempSrc:      ?Weight:      ?Height:      ? ?FHR reactive ?UCs q ? ?Dilation: 4.5 ?Effacement (%): 80 ?Station: -3 ?Exam by:: Wynelle Bourgeois CNM ? ?Foley in vagina, out of cervix ?Foley removed ? ?Will given Fentanyl prn and epidural prn ?Will watch pattern for 30 min then Pit if necessary ?

## 2021-09-27 NOTE — H&P (Signed)
?Ariel Cardenas is a 31 y.o. female G3P1011 with IUP at [redacted]w[redacted]d by LMP presenting for IOL for GDMA1.  ?She reports positive fetal movement. She denies leakage of fluid or vaginal bleeding. ? ?Prenatal History/Complications: ?PNC at [facility] ?Pregnancy complications:  ?- Past Medical History: ?Past Medical History:  ?Diagnosis Date  ? Gestational diabetes   ? Medical history non-contributory   ? ? ?Past Surgical History: ?Past Surgical History:  ?Procedure Laterality Date  ? BACK SURGERY    ? ? ?Obstetrical History: ?OB History   ? ? Gravida  ?3  ? Para  ?1  ? Term  ?1  ? Preterm  ?   ? AB  ?1  ? Living  ?1  ?  ? ? SAB  ?1  ? IAB  ?   ? Ectopic  ?   ? Multiple  ?   ? Live Births  ?1  ?   ?  ?  ? ? ? ?Social History: ?Social History  ? ?Socioeconomic History  ? Marital status: Married  ?  Spouse name: Not on file  ? Number of children: Not on file  ? Years of education: Not on file  ? Highest education level: Not on file  ?Occupational History  ? Not on file  ?Tobacco Use  ? Smoking status: Never  ? Smokeless tobacco: Never  ?Vaping Use  ? Vaping Use: Never used  ?Substance and Sexual Activity  ? Alcohol use: No  ? Drug use: No  ? Sexual activity: Yes  ?Other Topics Concern  ? Not on file  ?Social History Narrative  ? ** Merged History Encounter **  ?    ? ?Social Determinants of Health  ? ?Financial Resource Strain: Not on file  ?Food Insecurity: No Food Insecurity  ? Worried About Charity fundraiser in the Last Year: Never true  ? Ran Out of Food in the Last Year: Never true  ?Transportation Needs: Not on file  ?Physical Activity: Not on file  ?Stress: Not on file  ?Social Connections: Not on file  ? ? ?Family History: ?Family History  ?Problem Relation Age of Onset  ? Allergies Mother   ? Allergies Brother   ? ? ?Allergies: ?No Known Allergies ? ?Medications Prior to Admission  ?Medication Sig Dispense Refill Last Dose  ? aspirin 81 MG chewable tablet Chew 1 tablet (81 mg total) by mouth daily. 30 tablet 11 09/27/2021  ?  Accu-Chek Softclix Lancets lancets Pt to check blood glucose QID (Patient not taking: Reported on 09/19/2021) 100 each 12   ? betamethasone valerate (VALISONE) 0.1 % cream Apply topically 2 (two) times daily. (Patient not taking: Reported on 09/19/2021) 30 g 0   ? blood glucose meter kit and supplies KIT Dispense based on patient and insurance preference. Use up to four times daily as directed. (Patient not taking: Reported on 09/19/2021) 1 each 0   ? blood glucose meter kit and supplies KIT Dispense based on patient and insurance preference. Use up to four times daily as directed. (Patient not taking: Reported on 09/19/2021) 1 each 0   ? Ferrous Fumarate 150 MG TABS Take 1 tablet (150 mg total) by mouth every other day. Take with food (Patient not taking: Reported on 08/08/2021) 30 tablet 5   ? glucose blood (ACCU-CHEK GUIDE) test strip Pt to use to check blood glucose QID (Patient not taking: Reported on 09/19/2021) 100 each 12   ? hydrOXYzine (VISTARIL) 25 MG capsule Take 1 capsule (25 mg total)  by mouth 3 (three) times daily as needed. (Patient not taking: Reported on 09/19/2021) 30 capsule 0   ? Prenatal Vit-Fe Fumarate-FA (PRENATAL VITAMINS) 28-0.8 MG TABS Take by mouth.     ? valACYclovir (VALTREX) 500 MG tablet Take 4 tablets (2,000 mg total) by mouth 2 (two) times daily. (Patient not taking: Reported on 09/11/2021) 8 tablet 0   ? ? ?Review of Systems  ? ?Constitutional: Negative for fever and chills ?Eyes: Negative for visual disturbances ?Respiratory: Negative for shortness of breath, dyspnea ?Cardiovascular: Negative for chest pain or palpitations  ?Gastrointestinal: Negative for vomiting, diarrhea and constipation.  POSITIVE for abdominal pain (contractions) ?Genitourinary: Negative for dysuria and urgency ?Musculoskeletal: Negative for back pain, joint pain, myalgias  ?Neurological: Negative for dizziness and headaches ? ?Blood pressure 124/88, pulse 97, temperature 98.2 ?F (36.8 ?C), temperature source Oral, resp.  rate 16, height $RemoveBe'5\' 1"'GcfLNnvAi$  (1.549 m), weight 81.6 kg, last menstrual period 01/04/2021. ?General appearance: no distress ?Lungs: normal respiratory effort ?Heart: regular rate and rhythm ?Abdomen: soft, non-tender; bowel sounds normal ?Extremities: Homans sign is negative, no sign of DVT ?DTR's 2+ ?Presentation: cephalic ?Fetal monitoring  Baseline: 135 bpm; mod var, present acel, no decels ?Uterine activity  uterine irritability ?Dilation: 1 ?Effacement (%): 50 ?Station: -3 ?Exam by:: Alexis Martinique RN ? ? ?Prenatal labs: ?ABO, Rh: --/--/O POS (04/12 1620) ?Antibody: NEG (04/12 1620) ?Rubella: 1.52 (09/27 1102) ?RPR: NON-REACTIVE (01/24 0906)  ?HBsAg: NON-REACTIVE (09/27 1102)  ?HIV: NON-REACTIVE (01/24 0906)  ?GBS:   Neg ?1 hr Glucola  ? 69     ?Comment: Verified by repeat analysis.  ?.   ?Glucose, 1 Hr, Gest 65 - 179 mg/dL 106    ?Comment: Verified by repeat analysis.  ?.   ?Glucose, 2 Hr, Gest 65 - 152 mg/dL 157 High     ? ?Genetic screening  normal ?Anatomy US normal ? ?Prenatal Transfer Tool  ?Maternal Diabetes: Yes:  Diabetes Type:  Diet controlled ?Genetic Screening: Normal ?Maternal Ultrasounds/Referrals: Normal ?Fetal Ultrasounds or other Referrals:  Referred to [facility]  ?Maternal Substance Abuse:  No ?Significant Maternal Medications:  baby ASA and PNV ?Significant Maternal Lab Results: Group B Strep negative ? ?Results for orders placed or performed during the hospital encounter of 09/27/21 (from the past 24 hour(s))  ?Type and screen  ? Collection Time: 09/27/21  4:20 PM  ?Result Value Ref Range  ? ABO/RH(D) O POS   ? Antibody Screen NEG   ? Sample Expiration    ?  09/30/2021,2359 ?Performed at [facility], [place] [address] [place], [place] [address] ?  ?Glucose, capillary  ? Collection Time: 09/27/21  4:39 PM  ?Result Value Ref Range  ? Glucose-Capillary 103 (H) 70 - 99 mg/dL  ? ? ?Assessment: ?Houston Loser is a 31 y.o. G3P1011 with an IUP at [redacted]w[redacted]d presenting for IOL for GDMA1. Per last  Prenatal visit, patient has has had well-controlled GDMA with only 2 values out of recent 18 out of range.  ? ?Plan: ?#Labor: expectant management ?#Pain:  Per request ?#FWB Cat 1 ?#ID: GBS: Neg ?#MOF:  breast ?#MOC: POP ?#Circ: uncertain if patient or outpatient  ? ? ?Starr Lake ?09/27/2021, 5:31 PM ? ?

## 2021-09-28 ENCOUNTER — Inpatient Hospital Stay (HOSPITAL_COMMUNITY): Payer: Medicaid Other | Admitting: Anesthesiology

## 2021-09-28 ENCOUNTER — Encounter (HOSPITAL_COMMUNITY): Payer: Self-pay | Admitting: Family Medicine

## 2021-09-28 DIAGNOSIS — Z3A39 39 weeks gestation of pregnancy: Secondary | ICD-10-CM

## 2021-09-28 DIAGNOSIS — O24420 Gestational diabetes mellitus in childbirth, diet controlled: Secondary | ICD-10-CM

## 2021-09-28 LAB — RPR: RPR Ser Ql: NONREACTIVE

## 2021-09-28 LAB — GLUCOSE, CAPILLARY
Glucose-Capillary: 89 mg/dL (ref 70–99)
Glucose-Capillary: 94 mg/dL (ref 70–99)
Glucose-Capillary: 80 mg/dL (ref 70–99)
Glucose-Capillary: 77 mg/dL (ref 70–99)
Glucose-Capillary: 74 mg/dL (ref 70–99)

## 2021-09-28 MED ORDER — SIMETHICONE 80 MG PO CHEW: 80.0000 mg | CHEWABLE_TABLET | ORAL | Status: DC | PRN

## 2021-09-28 MED ORDER — FENTANYL-BUPIVACAINE-NACL 0.5-0.125-0.9 MG/250ML-% EP SOLN
EPIDURAL | Status: DC | PRN
Start: 2021-09-28 — End: 2021-09-28
  Administered 2021-09-28: 12 mL/h via EPIDURAL

## 2021-09-28 MED ORDER — TERBUTALINE SULFATE 1 MG/ML IJ SOLN: 0.2500 mg | Freq: Once | INTRAMUSCULAR | Status: DC | PRN

## 2021-09-28 MED ORDER — SODIUM CHLORIDE 0.9 % IV SOLN
2.0000 g | Freq: Once | INTRAVENOUS | Status: AC
  Administered 2021-09-28: 2 g via INTRAVENOUS
  Filled 2021-09-28: qty 2

## 2021-09-28 MED ORDER — COCONUT OIL OIL
1.0000 | TOPICAL_OIL | Status: DC | PRN
Start: 2021-09-28 — End: 2021-09-29

## 2021-09-28 MED ORDER — EPINEPHRINE PF 1 MG/ML IJ SOLN
0.3000 mg | Freq: Once | INTRAMUSCULAR | Status: DC | PRN
  Filled 2021-09-28: qty 1

## 2021-09-28 MED ORDER — ONDANSETRON HCL 4 MG/2ML IJ SOLN: 4.0000 mg | INTRAMUSCULAR | Status: DC | PRN

## 2021-09-28 MED ORDER — METHYLPREDNISOLONE SODIUM SUCC 125 MG IJ SOLR: 125.0000 mg | Freq: Once | INTRAMUSCULAR | Status: DC | PRN

## 2021-09-28 MED ORDER — MEDROXYPROGESTERONE ACETATE 150 MG/ML IM SUSP: 150.0000 mg | INTRAMUSCULAR | Status: DC | PRN

## 2021-09-28 MED ORDER — METHYLERGONOVINE MALEATE 0.2 MG PO TABS: 0.2000 mg | ORAL_TABLET | ORAL | Status: DC | PRN

## 2021-09-28 MED ORDER — PRENATAL MULTIVITAMIN CH
1.0000 | ORAL_TABLET | Freq: Every day | ORAL | Status: DC
Start: 2021-09-28 — End: 2021-09-29
  Administered 2021-09-28 – 2021-09-29 (×2): 1 via ORAL
  Filled 2021-09-28 (×2): qty 1

## 2021-09-28 MED ORDER — TRANEXAMIC ACID-NACL 1000-0.7 MG/100ML-% IV SOLN
INTRAVENOUS | Status: AC
  Administered 2021-09-28: 1000 mg
  Filled 2021-09-28: qty 100

## 2021-09-28 MED ORDER — DEXTROSE 5 % IV SOLN: 3.0000 g | Freq: Once | INTRAVENOUS | Status: DC

## 2021-09-28 MED ORDER — WITCH HAZEL-GLYCERIN EX PADS: 1.0000 "application " | MEDICATED_PAD | CUTANEOUS | Status: DC | PRN

## 2021-09-28 MED ORDER — TRANEXAMIC ACID-NACL 1000-0.7 MG/100ML-% IV SOLN: 1000.0000 mg | Freq: Once | INTRAVENOUS | Status: AC

## 2021-09-28 MED ORDER — ACETAMINOPHEN 325 MG PO TABS: 650.0000 mg | ORAL_TABLET | ORAL | Status: DC | PRN

## 2021-09-28 MED ORDER — ONDANSETRON HCL 4 MG PO TABS: 4.0000 mg | ORAL_TABLET | ORAL | Status: DC | PRN

## 2021-09-28 MED ORDER — DIPHENHYDRAMINE HCL 50 MG/ML IJ SOLN: 25.0000 mg | Freq: Once | INTRAMUSCULAR | Status: DC | PRN

## 2021-09-28 MED ORDER — SODIUM CHLORIDE 0.9 % IV SOLN: INTRAVENOUS | Status: DC | PRN

## 2021-09-28 MED ORDER — IBUPROFEN 600 MG PO TABS
600.0000 mg | ORAL_TABLET | Freq: Four times a day (QID) | ORAL | Status: DC
Start: 2021-09-28 — End: 2021-09-29
  Administered 2021-09-28 – 2021-09-29 (×4): 600 mg via ORAL
  Filled 2021-09-28 (×4): qty 1

## 2021-09-28 MED ORDER — LIDOCAINE HCL (PF) 1 % IJ SOLN
INTRAMUSCULAR | Status: DC | PRN
  Administered 2021-09-28: 5 mL via EPIDURAL

## 2021-09-28 MED ORDER — ALBUTEROL SULFATE (2.5 MG/3ML) 0.083% IN NEBU: 2.5000 mg | INHALATION_SOLUTION | Freq: Once | RESPIRATORY_TRACT | Status: DC | PRN

## 2021-09-28 MED ORDER — SODIUM CHLORIDE 0.9 % IV SOLN
500.0000 mg | Freq: Once | INTRAVENOUS | Status: AC
  Administered 2021-09-28: 500 mg via INTRAVENOUS
  Filled 2021-09-28: qty 25

## 2021-09-28 MED ORDER — DIPHENHYDRAMINE HCL 25 MG PO CAPS: 25.0000 mg | ORAL_CAPSULE | Freq: Four times a day (QID) | ORAL | Status: DC | PRN

## 2021-09-28 MED ORDER — BISACODYL 10 MG RE SUPP: 10.0000 mg | Freq: Every day | RECTAL | Status: DC | PRN

## 2021-09-28 MED ORDER — BENZOCAINE-MENTHOL 20-0.5 % EX AERO
1.0000 "application " | INHALATION_SPRAY | CUTANEOUS | Status: DC | PRN
  Administered 2021-09-28: 1 via TOPICAL
  Filled 2021-09-28: qty 56

## 2021-09-28 MED ORDER — METHYLERGONOVINE MALEATE 0.2 MG/ML IJ SOLN: 0.2000 mg | INTRAMUSCULAR | Status: DC | PRN

## 2021-09-28 MED ORDER — FLEET ENEMA 7-19 GM/118ML RE ENEM: 1.0000 | ENEMA | Freq: Every day | RECTAL | Status: DC | PRN

## 2021-09-28 MED ORDER — OXYTOCIN-SODIUM CHLORIDE 30-0.9 UT/500ML-% IV SOLN
1.0000 m[IU]/min | INTRAVENOUS | Status: DC
  Administered 2021-09-28: 2 m[IU]/min via INTRAVENOUS

## 2021-09-28 MED ORDER — FERROUS SULFATE 325 (65 FE) MG PO TABS
325.0000 mg | ORAL_TABLET | ORAL | Status: DC
Start: 2021-09-29 — End: 2021-09-29
  Administered 2021-09-29: 325 mg via ORAL
  Filled 2021-09-28: qty 1

## 2021-09-28 MED ORDER — SODIUM CHLORIDE 0.9 % IV BOLUS: 500.0000 mL | Freq: Once | INTRAVENOUS | Status: DC | PRN

## 2021-09-28 MED ORDER — DIBUCAINE (PERIANAL) 1 % EX OINT: 1.0000 "application " | TOPICAL_OINTMENT | CUTANEOUS | Status: DC | PRN

## 2021-09-28 MED ORDER — TETANUS-DIPHTH-ACELL PERTUSSIS 5-2.5-18.5 LF-MCG/0.5 IM SUSY
0.5000 mL | PREFILLED_SYRINGE | Freq: Once | INTRAMUSCULAR | Status: AC
  Administered 2021-09-29: 0.5 mL via INTRAMUSCULAR
  Filled 2021-09-28: qty 0.5

## 2021-09-28 MED ORDER — MEASLES, MUMPS & RUBELLA VAC IJ SOLR: 0.5000 mL | Freq: Once | INTRAMUSCULAR | Status: DC

## 2021-09-28 MED ORDER — DOCUSATE SODIUM 100 MG PO CAPS
100.0000 mg | ORAL_CAPSULE | Freq: Two times a day (BID) | ORAL | Status: DC
Start: 2021-09-28 — End: 2021-09-29
  Administered 2021-09-29: 100 mg via ORAL
  Filled 2021-09-28: qty 1

## 2021-09-28 NOTE — Anesthesia Postprocedure Evaluation (Signed)
Anesthesia Post Note ? ?Patient: Ariel Cardenas ? ?Procedure(s) Performed: AN AD HOC LABOR EPIDURAL ? ?  ? ?Patient location during evaluation: Mother Baby ?Anesthesia Type: Epidural ?Level of consciousness: awake and alert ?Pain management: pain level controlled ?Vital Signs Assessment: post-procedure vital signs reviewed and stable ?Respiratory status: spontaneous breathing, nonlabored ventilation and respiratory function stable ?Cardiovascular status: stable ?Postop Assessment: no headache, no backache and epidural receding ?Anesthetic complications: no ? ? ?No notable events documented. ? ?Last Vitals:  ?Vitals:  ? 09/28/21 1431 09/28/21 1445  ?BP: 119/79 116/72  ?Pulse: (!) 102 89  ?Resp:  17  ?Temp:  36.8 ?C  ?SpO2:    ?  ?Last Pain:  ?Vitals:  ? 09/28/21 1445  ?TempSrc: Oral  ?PainSc: 0-No pain  ? ?Pain Goal:   ? ?  ?  ?  ?  ?  ?  ?  ? ?Niyonna Betsill ? ? ? ? ?

## 2021-09-28 NOTE — Lactation Note (Signed)
This note was copied from a baby's chart. ?Lactation Consultation Note ? ?Patient Name: Boy Soul Adkins ?Today's Date: 09/28/2021 ?Reason for consult: Initial assessment;Term;Maternal endocrine disorder ?Age:32 hours ? ?Initial visit to 28 hours old infant of a P2 mother. Baby is skin to skin with father. Parents state baby is very sleepy and did not latch. Reviewed hand expression, collected ~1-mL. Infant is sleepy and uninterested. LC rubbed expressed colostrum to gums.  ?Discussed normal newborn behavior and patterns, signs of good milk transfer, hunger cues, tummy size and benefits of skin to skin.  ? ?Plan: ?1-Breastfeeding on demand or 8-12 times in 24h period. ?2-Hand express as needed ?3-Encouraged maternal rest, hydration and food intake.  ? ?Contact LC as needed for feeds/support/concerns/questions. All questions answered at this time. Provided Lactation services brochure and promoted [employer] booklet information.    ? ?Maternal Data ?Has patient been taught Hand Expression?: Yes ?Does the patient have breastfeeding experience prior to this delivery?: Yes ?How long did the patient breastfeed?: 7 months ? ?Feeding ?Mother's Current Feeding Choice: Breast Milk ? ?Interventions ?Interventions: Breast feeding basics reviewed;Skin to skin;Hand express;Breast massage;Expressed milk;Education;LC Services brochure ? ?Discharge ?Pump: Personal ?[place] Program: No ? ?Consult Status ?Consult Status: Follow-up ?Date: 09/29/21 ?Follow-up type: In-patient ? ? ? ?Jakhari Space A Higuera Ancidey ?09/28/2021, 9:22 PM ? ? ? ?

## 2021-09-28 NOTE — Progress Notes (Signed)
0715 CBG 80. Not transferring over from glucometer  ? ?Ameya Kutz B [place], RN ? ?

## 2021-09-28 NOTE — Discharge Summary (Signed)
? ?  Postpartum Discharge Summary ? ? ?   ?Patient Name: Ariel Cardenas ?DOB: Jan 09, 1991 ?MRN: [id number] ? ?Date of admission: 09/27/2021 ?Delivery date:09/28/2021  ?Delivering provider: Christin Fudge  ?Date of discharge: 09/29/2021 ? ?Admitting diagnosis: Gestational diabetes mellitus in pregnancy, diet controlled [O24.410] ?Intrauterine pregnancy: [redacted]w[redacted]d     ?Secondary diagnosis:  Principal Problem: ?  Gestational diabetes mellitus in pregnancy, diet controlled ? ?Additional problems: anemia of pregnancy    ?Discharge diagnosis: Term Pregnancy Delivered                                              ?Post partum procedures: IV venofer ?Augmentation: AROM, Pitocin, Cytotec, and IP Foley ?Complications: None ? ?Hospital course: Induction of Labor With Vaginal Delivery   ?31 y.o. yo G3P1011 at 101w1d was admitted to the hospital 09/27/2021 for induction of labor.  Indication for induction: A1 DM.  Patient had an uncomplicated labor course as follows: ?Membrane Rupture Time/Date: 9:47 AM ,09/28/2021   ?Delivery Method:Vaginal, Spontaneous  ?Episiotomy:   ?Lacerations:  1st degree;Vaginal  ?Details of delivery can be found in separate delivery note.  Patient had a routine postpartum course. Patient is discharged home 09/29/21. ? ?Newborn Data: ?Birth date:09/28/2021  ?Birth time:12:07 PM  ?Gender:Female  ?Living status:Living  ?Apgars:8 ,9  ?Weight:3210 g  ? ?Magnesium Sulfate received: No ?BMZ received: No ?Rhophylac:N/A ?MMR:N/A ?T-DaP:Given prenatally ?Flu: No ?Transfusion:No ? ?Physical exam  ?Vitals:  ? 09/28/21 1600 09/28/21 1945 09/28/21 2347 09/29/21 0535  ?BP: 122/83 124/84 126/86 129/87  ?Pulse: 87 86 78 82  ?Resp: 17     ?Temp: 98.6 ?F (37 ?C)     ?TempSrc: Oral     ?SpO2:      ?Weight:      ?Height:      ? ?General: alert, cooperative, and no distress ?Lochia: appropriate ?Uterine Fundus: firm ?Incision: N/A ?DVT Evaluation: No evidence of DVT seen on physical exam. ?Negative Homan's sign. ?No cords or calf  tenderness. ?No significant calf/ankle edema. ?Labs: ?Lab Results  ?Component Value Date  ? WBC 8.3 09/29/2021  ? HGB 7.5 (L) 09/29/2021  ? HCT 23.4 (L) 09/29/2021  ? MCV 77.0 (L) 09/29/2021  ? PLT 190 09/29/2021  ? ? ?  Latest Ref Rng & Units 03/14/2021  ? 11:04 AM  ?CMP  ?Glucose 65 - 139 mg/dL 65    ?BUN 7 - 25 mg/dL 10    ?Creatinine 0.50 - 0.97 mg/dL 0.56    ?Sodium 135 - 146 mmol/L 136    ?Potassium 3.5 - 5.3 mmol/L 4.4    ?Chloride 98 - 110 mmol/L 105    ?CO2 20 - 32 mmol/L 23    ?Calcium 8.6 - 10.2 mg/dL 9.4    ?Total Protein 6.1 - 8.1 g/dL 6.9    ?Total Bilirubin 0.2 - 1.2 mg/dL 0.3    ?AST 10 - 30 U/L 11    ?ALT 6 - 29 U/L 10    ? ?Edinburgh Score: ? ?  09/28/2021  ?  5:37 PM  ?Flavia Shipper Postnatal Depression Scale Screening Tool  ?I have been able to laugh and see the funny side of things. 0  ?I have looked forward with enjoyment to things. 1  ?I have blamed myself unnecessarily when things went wrong. 1  ?I have been anxious or worried for no good reason. 1  ?I have felt scared  or panicky for no good reason. 0  ?Things have been getting on top of me. 1  ?I have been so unhappy that I have had difficulty sleeping. 0  ?I have felt sad or miserable. 0  ?I have been so unhappy that I have been crying. 0  ?The thought of harming myself has occurred to me. 0  ?Edinburgh Postnatal Depression Scale Total 4  ? ? ? ?After visit meds:  ?Allergies as of 09/29/2021   ?No Known Allergies ?  ? ?  ?Medication List  ?  ? ?STOP taking these medications   ? ?Accu-Chek Guide test strip ?Generic drug: glucose blood ?  ?Accu-Chek Softclix Lancets lancets ?  ?aspirin 81 MG chewable tablet ?  ?betamethasone valerate 0.1 % cream ?Commonly known as: VALISONE ?  ?hydrOXYzine 25 MG capsule ?Commonly known as: Vistaril ?  ?valACYclovir 500 MG tablet ?Commonly known as: Valtrex ?  ? ?  ? ?TAKE these medications   ? ?Ferrous Fumarate 150 MG Tabs ?Take 1 tablet (150 mg total) by mouth every other day. Take with food ?  ?ibuprofen 600 MG  tablet ?Commonly known as: ADVIL ?Take 1 tablet (600 mg total) by mouth every 6 (six) hours. ?  ?Prenatal Vitamins 28-0.8 MG Tabs ?Take by mouth. ?  ?Slynd 4 MG Tabs ?Generic drug: Drospirenone ?Take 1 tablet by mouth daily. Start on the Sunday after the baby turns 50 weeks old ?  ? ?  ? ? ? ?Discharge home in stable condition ?Infant Feeding: Breast ?Infant Disposition:home with mother ?Discharge instruction: per After Visit Summary and Postpartum booklet. ?Activity: Advance as tolerated. Pelvic rest for 6 weeks.  ?Diet: routine diet ?Future Appointments: ?Future Appointments  ?Date Time Provider [place]  ?10/31/2021  8:30 AM Renee Harder, CNM [facility]  ? ?Follow up Visit: ? ? ?Please schedule this patient for a Virtual postpartum visit in 4 weeks with the following provider: Any provider. ?Additional Postpartum F/U:  ?Low risk pregnancy complicated by: GDM ?Delivery mode:  Vaginal, Spontaneous  ?Anticipated Birth Control:  POPs ? ? ?09/29/2021 ?Christin Fudge, CNM ? ? ? ?

## 2021-09-28 NOTE — Progress Notes (Signed)
Labor Progress Note ?Ariel Cardenas is a 31 y.o. G3P1011 at [redacted]w[redacted]d presented for IOL for A1GDM ?S: Patient is resting comfortably. ? ?O:  ?BP (!) 141/94   Pulse 85   Temp 98.5 ?F (36.9 ?C) (Oral)   Resp 16   Ht 5\' 1"  (1.549 m)   Wt 81.6 kg   LMP 01/04/2021   BMI 34.01 kg/m?  ?EFM: 130 baseline /Moderate variability/+accelerations  ? ?CVE: Dilation: 6 ?Effacement (%): 80 ?Station: -2 ?Exam by:: Alexis [email or web address] RN ? ? ?A&P: 31 y.o. G3P1011 [redacted]w[redacted]d  ?#Labor: Progressing well. Will reassess in 3-4 hours and will consider AROM at that time if membrane still intact ?#Pain: IV PRN, Epidural if patient request ?#FWB: Cat 1 ?#GBS negative ? ? ?[redacted]w[redacted]d, MD ?[facility] ?2:53 AM  ?

## 2021-09-28 NOTE — Progress Notes (Signed)
Patient Vitals for the past 4 hrs: ? BP Pulse Resp  ?09/28/21 0901 107/66 81 --  ?09/28/21 0830 107/67 86 --  ?09/28/21 0801 107/69 79 16  ?09/28/21 0731 (!) 128/92 70 --  ?09/28/21 0701 130/83 78 16  ?09/28/21 0630 124/83 80 16  ?09/28/21 0601 119/75 74 16  ? ?Comfortable w/epidural. Contracting about q 15 minutes.  Cx 8/80/-2.  AROM w/clear fluid.  Will start pitocin in about 30 minutes if not in labor.  ?

## 2021-09-28 NOTE — Anesthesia Procedure Notes (Signed)
Epidural ?Patient location during procedure: OB ?Start time: 09/28/2021 4:43 AM ?End time: 09/28/2021 4:56 AM ? ?Staffing ?Anesthesiologist: Trevor Iha, MD ?Performed: anesthesiologist  ? ?Preanesthetic Checklist ?Completed: patient identified, IV checked, site marked, risks and benefits discussed, surgical consent, monitors and equipment checked, pre-op evaluation and timeout performed ? ?Epidural ?Patient position: sitting ?Prep: DuraPrep and site prepped and draped ?Patient monitoring: continuous pulse ox and blood pressure ?Approach: midline ?Location: L3-L4 ?Injection technique: LOR air ? ?Needle:  ?Needle type: Tuohy  ?Needle gauge: 17 G ?Needle length: 9 cm and 9 ?Needle insertion depth: 6 cm ?Catheter type: closed end flexible ?Catheter size: 19 Gauge ?Catheter at skin depth: 12 cm ?Test dose: negative ? ?Assessment ?Events: blood not aspirated, injection not painful, no injection resistance, no paresthesia and negative IV test ? ?Additional Notes ?Patient identified. Risks/Benefits/Options discussed with patient including but not limited to bleeding, infection, nerve damage, paralysis, failed block, incomplete pain control, headache, blood pressure changes, nausea, vomiting, reactions to medication both or allergic, itching and postpartum back pain. Confirmed with bedside nurse the patient's most recent platelet count. Confirmed with patient that they are not currently taking any anticoagulation, have any bleeding history or any family history of bleeding disorders. Patient expressed understanding and wished to proceed. All questions were answered. Sterile technique was used throughout the entire procedure. Please see nursing notes for vital signs. Test dose was given through epidural needle and negative prior to continuing to dose epidural or start infusion. Warning signs of high block given to the patient including shortness of breath, tingling/numbness in hands, complete motor block, or any  concerning symptoms with instructions to call for help. Patient was given instructions on fall risk and not to get out of bed. All questions and concerns addressed with instructions to call with any issues.  1 Attempt (S) . Patient tolerated procedure well. ? ? ? ?

## 2021-09-28 NOTE — Lactation Note (Signed)
This note was copied from a baby's chart. ?Lactation Consultation Note ? ?Patient Name: Boy Loyda Mankowski ?Today's Date: 09/28/2021 ?Reason for consult: L&D Initial assessment;1st time breastfeeding;Primapara;Term ?Age:32 hours ? ? ?Initial L&D Consult: ? ?Attempted to visit with family < 1 hour after delivery ?Met RN in hallway and she reported mother desired to latch independently and was successful.  RN verified a good latch.  Family will be followed on the M/B unit. ? ? ?Maternal Data ?  ? ?Feeding ?Mother's Current Feeding Choice: Breast Milk ? ?LATCH Score ?Latch: Grasps breast easily, tongue down, lips flanged, rhythmical sucking. ? ?Audible Swallowing: Spontaneous and intermittent ? ?Type of Nipple: Everted at rest and after stimulation ? ?Comfort (Breast/Nipple): Soft / non-tender ? ?Hold (Positioning): No assistance needed to correctly position infant at breast. ? ?LATCH Score: 10 ? ? ?Lactation Tools Discussed/Used ?  ? ?Interventions ?  ? ?Discharge ?  ? ?Consult Status ?Consult Status: Follow-up from L&D ? ? ? ?Dareen Gutzwiller R Roland Lipke ?09/28/2021, 1:10 PM ? ? ? ?

## 2021-09-29 LAB — CBC
HCT: 23.4 % — ABNORMAL LOW (ref 36.0–46.0)
Hemoglobin: 7.5 g/dL — ABNORMAL LOW (ref 12.0–15.0)
MCH: 24.7 pg — ABNORMAL LOW (ref 26.0–34.0)
MCHC: 32.1 g/dL (ref 30.0–36.0)
MCV: 77 fL — ABNORMAL LOW (ref 80.0–100.0)
Platelets: 190 10*3/uL (ref 150–400)
RBC: 3.04 MIL/uL — ABNORMAL LOW (ref 3.87–5.11)
RDW: 14.1 % (ref 11.5–15.5)
WBC: 8.3 10*3/uL (ref 4.0–10.5)
nRBC: 0 % (ref 0.0–0.2)

## 2021-09-29 MED ORDER — IBUPROFEN 600 MG PO TABS: 600.0000 mg | ORAL_TABLET | Freq: Four times a day (QID) | ORAL | 0 refills | Status: AC

## 2021-09-29 MED ORDER — SLYND 4 MG PO TABS: 1.0000 | ORAL_TABLET | Freq: Every day | ORAL | 11 refills | Status: AC

## 2021-09-29 NOTE — Lactation Note (Signed)
This note was copied from a baby's chart. ?Lactation Consultation Note ? ?Patient Name: Ariel Cardenas ?Today's Date: 09/29/2021 ?Reason for consult: Follow-up assessment ?Age:32 hours ? ?P2 mother whose infant is now 68 hours old.  This is a term baby at 39+1 weeks.   ? ?Mother had baby latched when I arrived.  He had a wide gape, flanged lips and was actively sucking.  Mother denied pain with latching.  Last LATCH score was a 10; baby is voiding/stooling well.  Encouraged to continue feeding on cue after discharge.  Mother has our OP phone number for any concerns after discharge.  No support person present at this time. ? ? ?Maternal Data ?  ? ?Feeding ?  ? ?LATCH Score ?Latch: Grasps breast easily, tongue down, lips flanged, rhythmical sucking. ? ?Audible Swallowing: A few with stimulation ? ?Type of Nipple:  (Not observed due to baby being latched when I arrived) ? ?Comfort (Breast/Nipple): Soft / non-tender ? ?Hold (Positioning): No assistance needed to correctly position infant at breast. ? ?  ? ? ?Lactation Tools Discussed/Used ?  ? ?Interventions ?  ? ?Discharge ?Discharge Education: Engorgement and breast care ? ?Consult Status ?Consult Status: Complete ?Date: 09/29/21 ?Follow-up type: Call as needed ? ? ? ?Tricia Oaxaca R Ghazal Pevey ?09/29/2021, 2:12 PM ? ? ? ?

## 2021-10-02 ENCOUNTER — Encounter: Payer: Self-pay | Admitting: Advanced Practice Midwife

## 2021-10-02 LAB — SURGICAL PATHOLOGY

## 2021-10-03 ENCOUNTER — Telehealth: Payer: Self-pay | Admitting: Advanced Practice Midwife

## 2021-10-03 ENCOUNTER — Ambulatory Visit (INDEPENDENT_AMBULATORY_CARE_PROVIDER_SITE_OTHER): Payer: Medicaid Other | Admitting: Advanced Practice Midwife

## 2021-10-03 ENCOUNTER — Encounter: Payer: Self-pay | Admitting: Advanced Practice Midwife

## 2021-10-03 VITALS — BP 137/89 | HR 82 | Resp 16 | Ht 61.0 in | Wt 168.0 lb

## 2021-10-03 DIAGNOSIS — O9089 Other complications of the puerperium, not elsewhere classified: Secondary | ICD-10-CM

## 2021-10-03 DIAGNOSIS — R519 Headache, unspecified: Secondary | ICD-10-CM

## 2021-10-03 LAB — CBC
HCT: 28.2 % — ABNORMAL LOW (ref 35.0–45.0)
Hemoglobin: 8.8 g/dL — ABNORMAL LOW (ref 11.7–15.5)
MCH: 24.2 pg — ABNORMAL LOW (ref 27.0–33.0)
MCHC: 31.2 g/dL — ABNORMAL LOW (ref 32.0–36.0)
MCV: 77.5 fL — ABNORMAL LOW (ref 80.0–100.0)
MPV: 10.2 fL (ref 7.5–12.5)
Platelets: 266 10*3/uL (ref 140–400)
RBC: 3.64 10*6/uL — ABNORMAL LOW (ref 3.80–5.10)
RDW: 15.1 % — ABNORMAL HIGH (ref 11.0–15.0)
WBC: 5.1 10*3/uL (ref 3.8–10.8)

## 2021-10-03 LAB — COMPREHENSIVE METABOLIC PANEL
AG Ratio: 1.2 (calc) (ref 1.0–2.5)
ALT: 19 U/L (ref 6–29)
AST: 18 U/L (ref 10–30)
Albumin: 3.4 g/dL — ABNORMAL LOW (ref 3.6–5.1)
Alkaline phosphatase (APISO): 89 U/L (ref 31–125)
BUN: 11 mg/dL (ref 7–25)
CO2: 27 mmol/L (ref 20–32)
Calcium: 8.5 mg/dL — ABNORMAL LOW (ref 8.6–10.2)
Chloride: 107 mmol/L (ref 98–110)
Creat: 0.55 mg/dL (ref 0.50–0.97)
Globulin: 2.9 g/dL (calc) (ref 1.9–3.7)
Glucose, Bld: 74 mg/dL (ref 65–139)
Potassium: 4.4 mmol/L (ref 3.5–5.3)
Sodium: 140 mmol/L (ref 135–146)
Total Bilirubin: 0.4 mg/dL (ref 0.2–1.2)
Total Protein: 6.3 g/dL (ref 6.1–8.1)

## 2021-10-03 NOTE — Telephone Encounter (Signed)
I called pt to review normal preeclampsia labs drawn today in the office. Pt presented PP day 5 with h/a. BP was normal in office today.  CBC shows hgb 8.8, so anemia likely contributing to h/a.  Pt not yet taking PNV or oral iron. Start PNV daily and oral iron every other day, discussed iron rich foods to add to diet.  Increase PO fluids to improve h/a. Will cancel BP check visit later this week and pt to call office as needed if symptom does not improve.  ?

## 2021-10-04 ENCOUNTER — Telehealth: Payer: Self-pay | Admitting: *Deleted

## 2021-10-04 NOTE — Telephone Encounter (Signed)
Left patient an urgent message that appointment is no longer needed per Fatima Blank. ?

## 2021-10-05 ENCOUNTER — Ambulatory Visit: Payer: Medicaid Other

## 2021-10-06 NOTE — Progress Notes (Signed)
? ?  GYNECOLOGY PROGRESS NOTE ? ?History:  ?31 y.o. [id number] presents to [facility] office today for a problem visit. She is Day 5 following NSVD with IOL for GDM. She did not have HTN during pregnancy or labor.  She reports h/a x 2 days and was concerned that her BP may be high at home so made appt today.  She has taken Tylenol and Ibuprofen but h/a does not resolve. It is frontal, constant, dull pain.  Her lochia is light and she denies an lower abdominal pain or fever/chills.  ? ?The following portions of the patient's history were reviewed and updated as appropriate: allergies, current medications, past family history, past medical history, past social history, past surgical history and problem list.  ? ?Health Maintenance Due  ?Topic Date Due  ? COVID-19 Vaccine (1) Never done  ? URINE MICROALBUMIN  Never done  ?  ? ?Review of Systems:  ?Pertinent items are noted in HPI. ?  ?Objective:  ?Physical Exam ?Blood pressure 137/89, pulse 82, resp. rate 16, height _0  (1.549 m), weight 168 lb (76.2 kg), currently breastfeeding. ? ?VS reviewed, nursing note reviewed,  ?Constitutional: well developed, well nourished, no distress ?HEENT: normocephalic ?CV: normal rate ?Pulm/chest wall: normal effort ?Breast Exam: deferred ?Abdomen: soft ?Neuro: alert and oriented x 3 ?Skin: warm, dry ?Psych: affect normal ? ? ?Assessment & Plan:  ?1. Postpartum headache ?--BP wnl today but high normal.  No HTN hx and HTN not found today.  H/a possible causes include lack of sleep, dehydration, and/or anemia.  Unlikely HTN/PEC. ? ?--Will obtain PEC labs in office, follow up with pt today ?--PEC precautions reviewed, reasons to seek care ?--BP check in 2 days ? ?- CBC ?- Comp Met (CMET) ? ? ?No follow-ups on file.  ? ?Fatima Blank, CNM ?3:36 PM  ?

## 2021-10-22 NOTE — Anesthesia Postprocedure Evaluation (Signed)
Anesthesia Post Note ? ?Patient: Ariel Cardenas ? ?Procedure(s) Performed: AN AD HOC LABOR EPIDURAL ? ?  ? ?Patient location during evaluation: Mother Baby ?Anesthesia Type: Epidural ?Level of consciousness: awake and alert ?Pain management: pain level controlled ?Vital Signs Assessment: post-procedure vital signs reviewed and stable ?Respiratory status: spontaneous breathing, nonlabored ventilation and respiratory function stable ?Cardiovascular status: stable ?Postop Assessment: no headache, no backache and epidural receding ?Anesthetic complications: no ? ? ?No notable events documented. ? ?Last Vitals: There were no vitals filed for this visit.  ?Last Pain: There were no vitals filed for this visit. ?Pain Goal:   ? ?  ?  ?  ?  ?  ?  ?  ? ?Trevor Iha ? ? ? ? ? ?

## 2021-10-22 NOTE — Addendum Note (Signed)
Addendum  created 10/22/21 1042 by Trevor Iha, MD  ? Clinical Note Signed  ?  ?

## 2021-10-31 ENCOUNTER — Telehealth: Payer: Medicaid Other

## 2021-10-31 ENCOUNTER — Telehealth: Payer: Self-pay

## 2021-10-31 NOTE — Telephone Encounter (Signed)
Attempted to call pt twice to begin virtual visit. Pt did not answer. VM left asking pt to return call to office. ?

## 2021-10-31 NOTE — Progress Notes (Deleted)
Provider location: [facility] at {Blank single:19197::"[facility]","[facility]","[facility]","[facility]","","[facility]","[facility]"}   Patient location: Home  I connected withNAME@ on 10/31/21 at  8:30 AM EDT by Mychart Video Encounter and verified that I am speaking with the correct person using two identifiers.       I discussed the limitations, risks, security and privacy concerns of performing an evaluation and management service virtually and the availability of in person appointments. I also discussed with the patient that there may be a patient responsible charge related to this service. The patient expressed understanding and agreed to proceed.  Post Partum Visit Note Subjective:   Ariel Cardenas is a 31 y.o. [id number] female who presents for a postpartum visit. She is 4 weeks postpartum following a normal spontaneous vaginal delivery.  I have fully reviewed the prenatal and intrapartum course. The delivery was at 39.1 gestational weeks.  Anesthesia: epidural. Postpartum course has been unremarkable. Baby is doing well. Baby is feeding by {breast/bottle:69}. Bleeding {vag bleed:12292}. Bowel function is {normal:32111}. Bladder function is {normal:32111}. Patient {is/is not:9024} sexually active. Contraception method is {contraceptive method:5051}. Postpartum depression screening: {gen negative/positive:315881}.   The pregnancy intention screening data noted above was reviewed. Potential methods of contraception were discussed. The patient elected to proceed with No data recorded.    {Common ambulatory SmartLinks:19316}  Review of Systems {ros; complete:30496}  Objective:  There were no vitals taken for this visit.    General:  Alert, oriented and cooperative. Patient is in no acute distress.  Respiratory: Normal respiratory effort, no problems with respiration noted  Mental Status: Normal mood and affect. Normal  behavior. Normal judgment and thought content.  Rest of physical exam deferred due to type of encounter   Assessment:    *** postpartum exam.  Plan:  Essential components of care per ACOG recommendations:  1.  Mood and well being: Patient with {gen negative/positive:315881} depression screening today. Reviewed local resources for support.  - Patient {Action; does/does not:19097} use tobacco. ***If using tobacco we discussed reduction and for recently cessation risk of relapse - hx of drug use? {yes/no:20286}  *** If yes, discussed support systems  2. Infant care and feeding:  -Patient currently breastmilk feeding? {yes/no:20286} ***If breastmilk feeding discussed return to work and pumping. If needed, patient was provided letter for work to allow for every 2-3 hr pumping breaks, and to be granted a private location to express breastmilk and refrigerated area to store breastmilk. Reviewed importance of draining breast regularly to support lactation. -Social determinants of health (SDOH) reviewed in EPIC. No concerns***The following needs were identified***  3. Sexuality, contraception and birth spacing - Patient {DOES_DOES ZHG:99242} want a pregnancy in the next year.  Desired family size is {NUMBER 1-10:22536} children.  - Reviewed forms of contraception in tiered fashion. Patient desired {PLAN CONTRACEPTION:313102} today.   - Discussed birth spacing of 18 months  4. Sleep and fatigue -Encouraged family/partner/community support of 4 hrs of uninterrupted sleep to help with mood and fatigue  5. Physical Recovery  - Discussed patients delivery*** and complications - Patient had a *** degree laceration, perineal healing reviewed. Patient expressed understanding - Patient has urinary incontinence? {yes/no:20286}*** Patient was referred to pelvic floor PT  - Patient {ACTION; IS/IS AST:41962229} safe to resume physical and sexual activity  6.  Health Maintenance - Last pap smear done ***  and was {Normal/abnormal wildcard:19619} with negative HPV. ***Mammogram  7. ***Chronic Disease - PCP follow up  I provided *** minutes of face-to-face time  during this encounter.    No follow-ups on file.  Future Appointments  Date Time Provider Department Center  10/31/2021  8:30 AM Brand Males, CNM [facility]    Kathie Dike, [facility] [employer], [facility]

## 2022-04-22 IMAGING — US US MFM OB DETAIL+14 WK
1 series · 13 of 28 positions shown · non-contrast
Comparison: none

[Series 1: us mfm ob detail+14 wk · 13 of 78 slices shown]
[im 3/78]
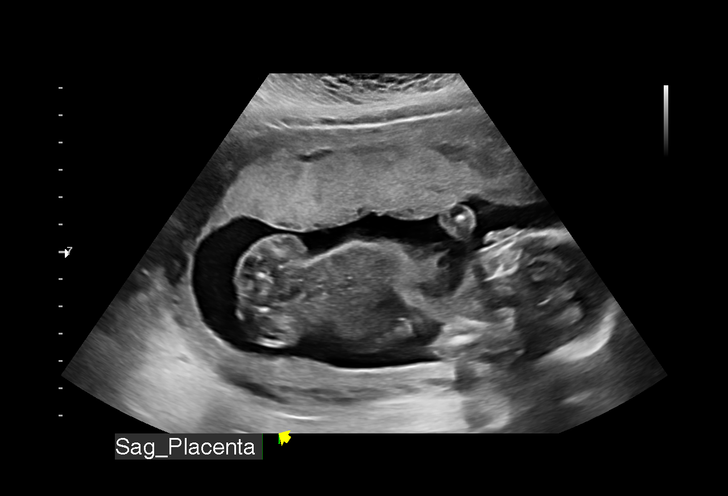
[im 9/78]
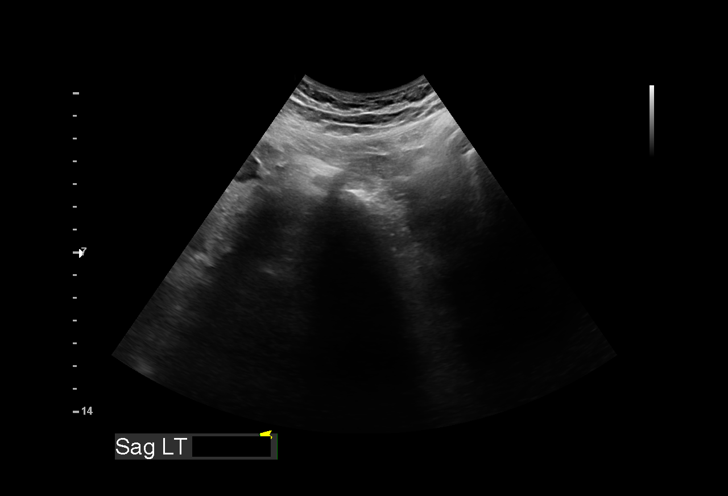
[im 15/78]
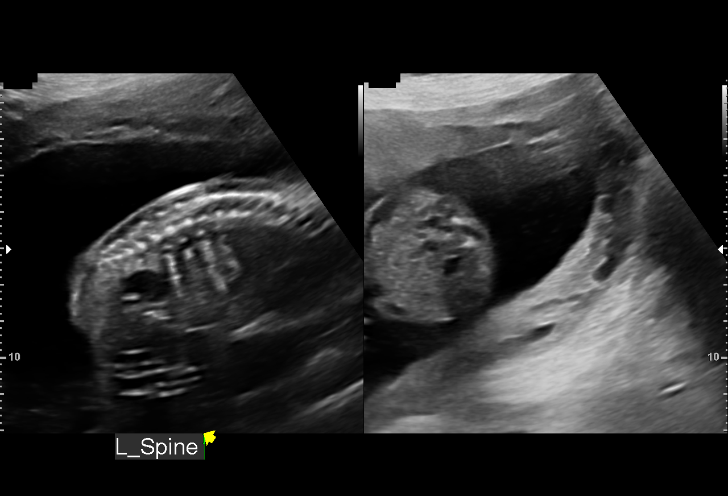
[im 20/78]
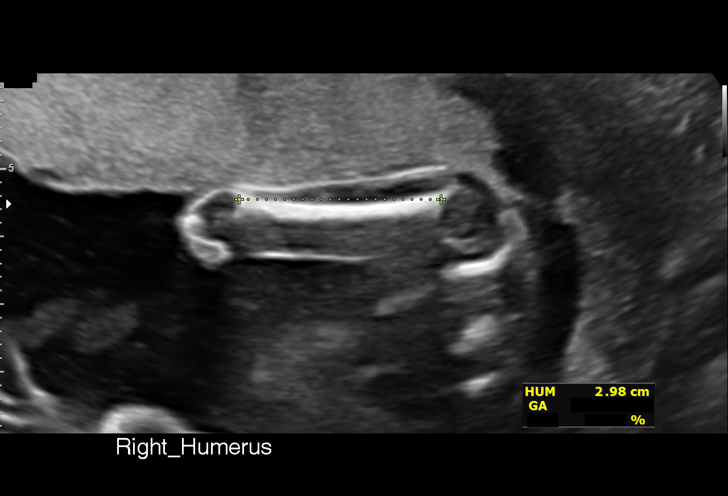
[im 26/78]
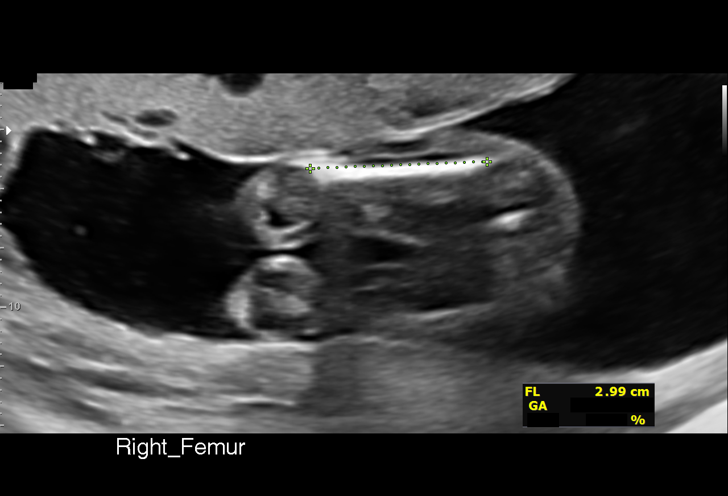
[im 32/78]
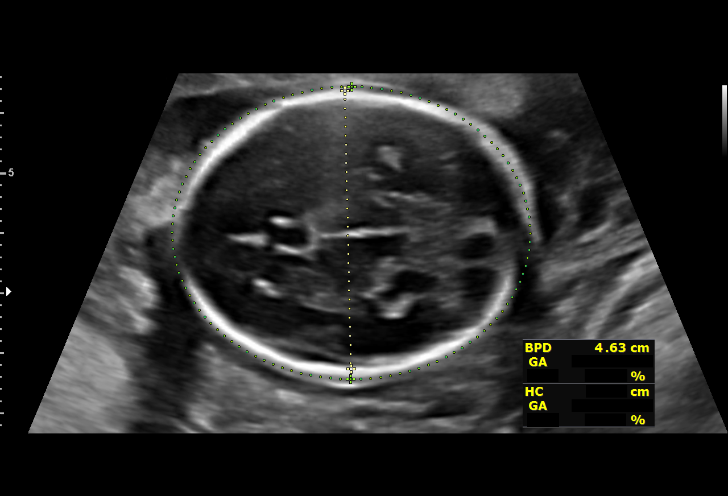
[im 40/78]
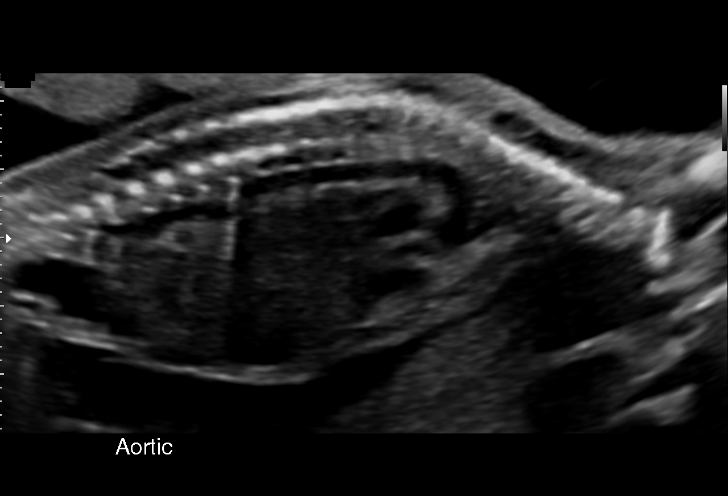
[im 46/78]
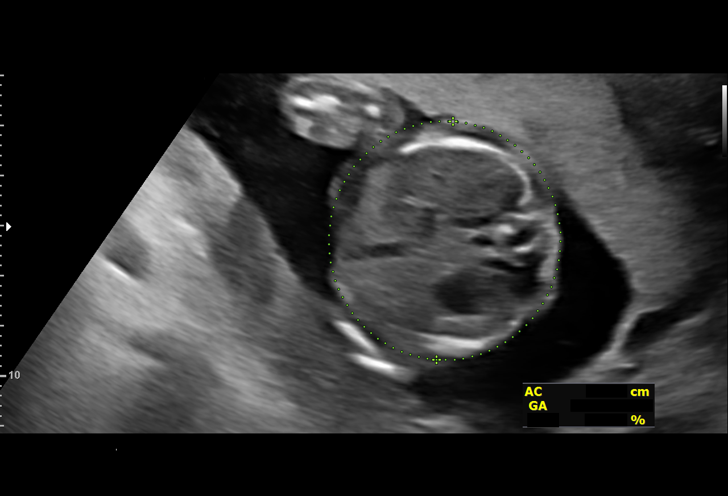
[im 52/78]
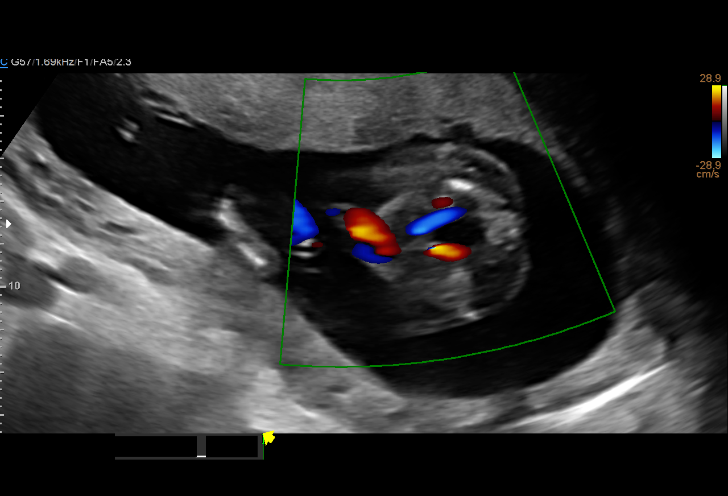
[im 58/78]
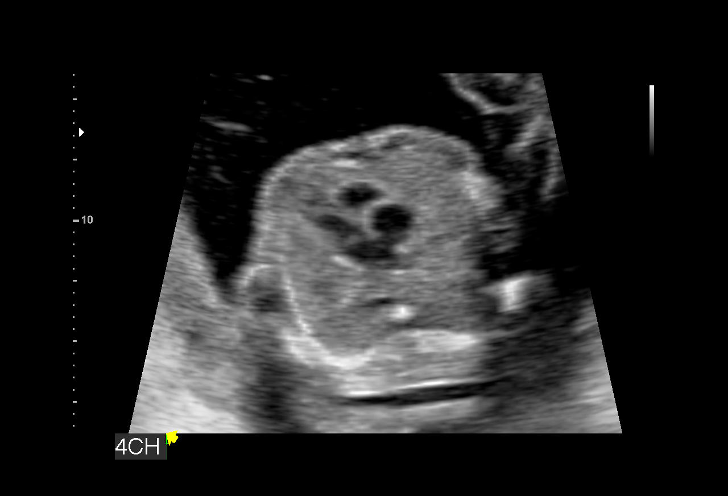
[im 63/78]
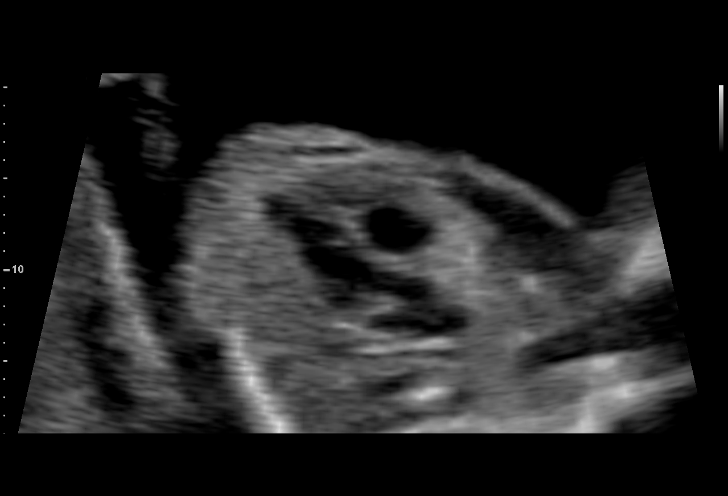
[im 69/78]
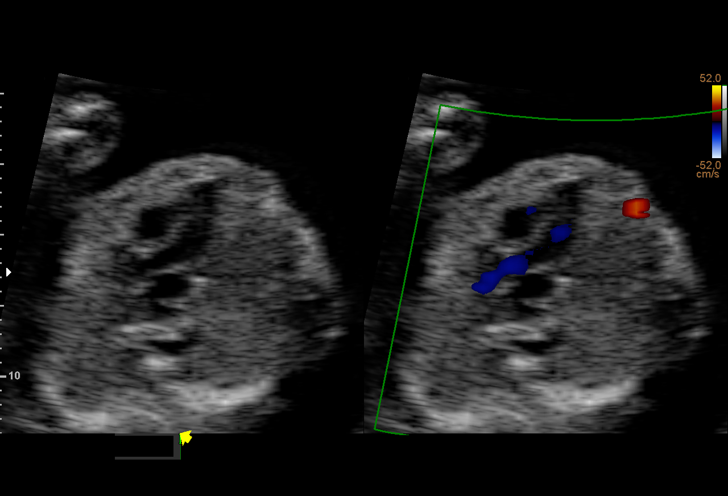
[im 75/78]
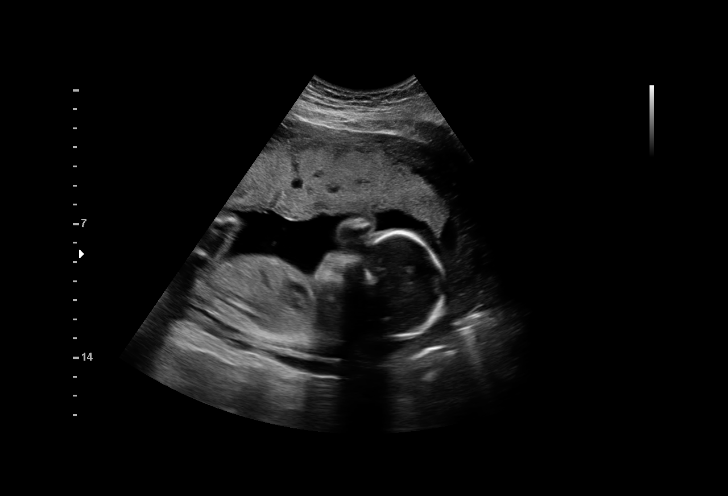

[13 of 28 positions shown; findings below may reference images not displayed]

Indications

 Poor obstetric history: Previous
 preeclampsia / eclampsia/gestational HTN
 LR NIPS/ Negative AFP
 20 weeks gestation of pregnancy
 Antenatal screening for malformations
Fetal Evaluation

 Num Of Fetuses:         1
 Fetal Heart Rate(bpm):  152
 Cardiac Activity:       Observed
 Presentation:           Cephalic
 Placenta:               Anterior
 P. Cord Insertion:      Visualized, central

 Amniotic Fluid
 AFI FV:      Within normal limits

                             Largest Pocket(cm)

Biometry

 BPD:      45.8  mm     G. Age:  19w 6d         37  %    CI:        73.15   %    70 - 86
                                                         FL/HC:      17.6   %    16.8 -
 HC:      170.2  mm     G. Age:  19w 4d         20  %    HC/AC:      1.16        1.09 -
 AC:      147.2  mm     G. Age:  20w 0d         39  %    FL/BPD:     65.3   %
 FL:       29.9  mm     G. Age:  19w 2d         14  %    FL/AC:      20.3   %    20 - 24
 HUM:        30  mm     G. Age:  19w 6d         45  %
 CER:      20.7  mm     G. Age:  19w 6d         59  %
 NFT:       2.9  mm
 LV:        5.6  mm
 CM:        6.3  mm

 Est. FW:     305  gm    0 lb 11 oz      21  %
OB History

 Gravidity:    3         Term:   1         SAB:   1
Gestational Age

 LMP:           19w 1d        Date:  01/04/21                 EDD:   10/11/21
 U/S Today:     19w 5d                                        EDD:   10/07/21
 Best:          20w 1d     Det. By:  Early Ultrasound         EDD:   10/04/21
                                     (03/14/21)
Anatomy

 Cranium:               Appears normal         LVOT:                   Appears normal
 Cavum:                 Appears normal         Aortic Arch:            Appears normal
 Ventricles:            Appears normal         Ductal Arch:            Appears normal
 Choroid Plexus:        Appears normal         Diaphragm:              Appears normal
 Cerebellum:            Appears normal         Stomach:                Appears normal, left
                                                                       sided
 Posterior Fossa:       Appears normal         Abdomen:                Appears normal
 Nuchal Fold:           Appears normal         Abdominal Wall:         Appears nml (cord
                                                                       insert, abd wall)
 Face:                  Appears normal         Cord Vessels:           Appears normal (3
                        (orbits and profile)                           vessel cord)
 Lips:                  Appears normal         Kidneys:                Appear normal
 Palate:                Appears normal         Bladder:                Appears normal
 Thoracic:              Appears normal         Spine:                  Appears normal
 Heart:                 Appears normal         Upper Extremities:      Appears normal
                        (4CH, axis, and
                        situs)
 RVOT:                  Appears normal         Lower Extremities:      Appears normal

 Other:  Fetus appears to be a male. Heels/feet, open hands/5th digits, nasal
         bone, lenses, VC, 3VV and 3VTV visualized.
Cervix Uterus Adnexa

 Cervix
 Length:           4.02  cm.
 Normal appearance by transabdominal scan.

 Uterus
 No abnormality visualized.

 Right Ovary
 Within normal limits.

 Left Ovary
 Within normal limits.

 Adnexa
 No adnexal mass visualized.
Impression

 Single intrauterine pregnancy here for a detailed anatomy
 Normal anatomy with measurements consistent with dates
 There is good fetal movement and amniotic fluid volume
 Ms. Mirgani had a low risk NIPS and AFP.
Recommendations

 Follow up growth as clinically indicated.

## 2022-08-16 IMAGING — US US MFM OB FOLLOW-UP
1 series · 14 of 28 positions shown · non-contrast
Comparison: none

[Series 1: us mfm ob follow-up · 42 acquisitions, 14 frames shown]
[im 2/42]
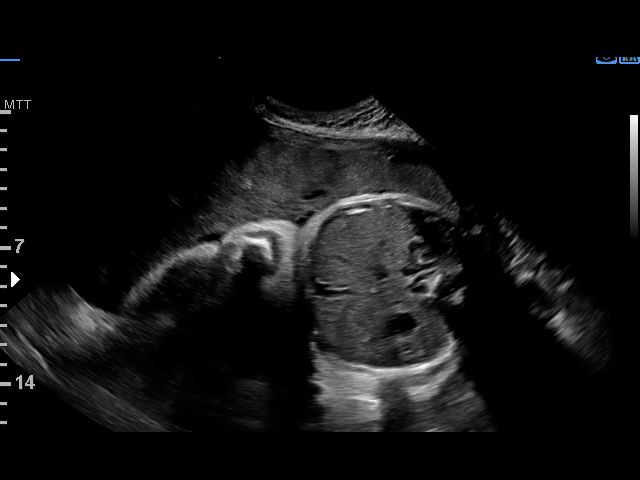
[im 5/42]
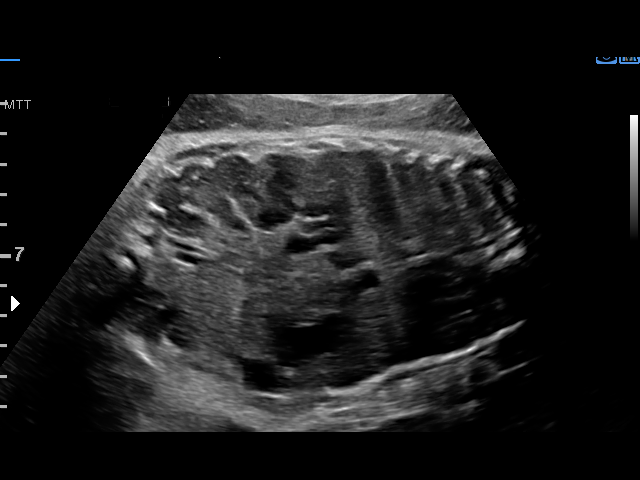
[im 8/42]
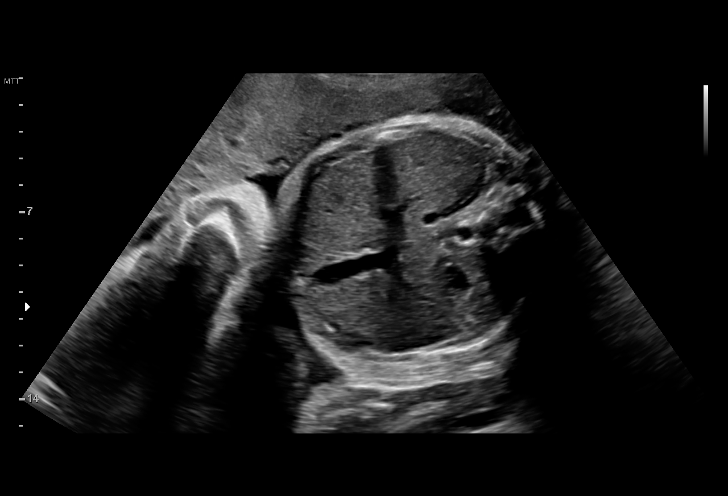
[im 11/42]
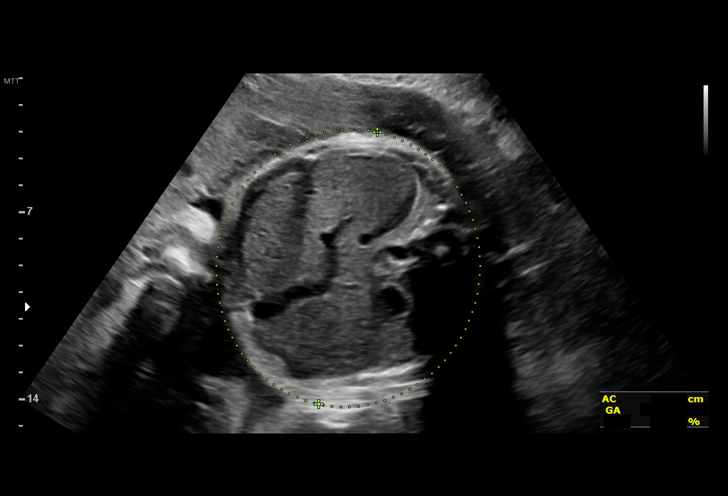
[im 14/42]
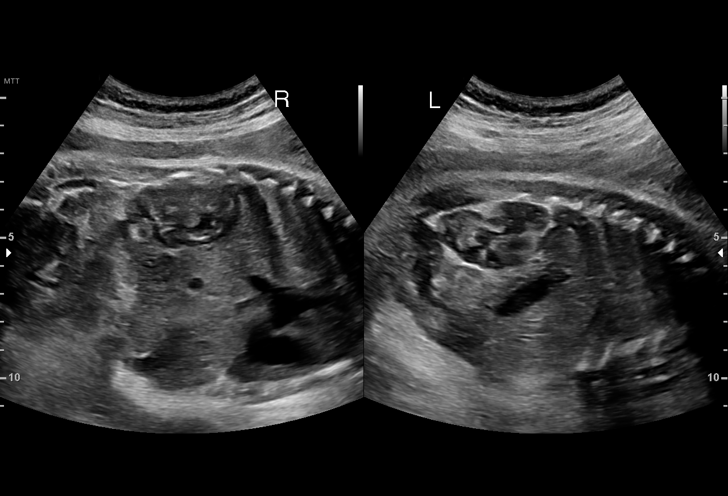
[im 17/42]
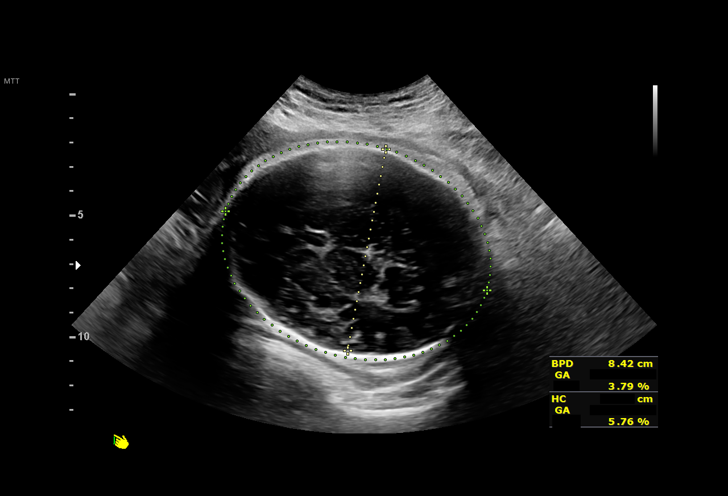
[im 20/42]
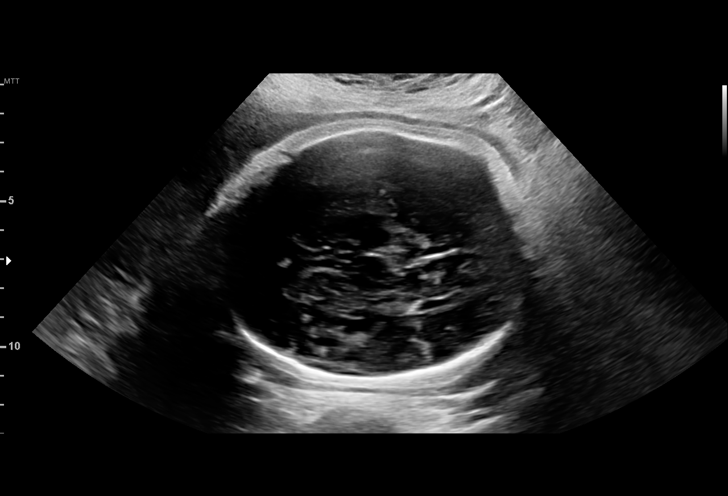
[im 23/42]
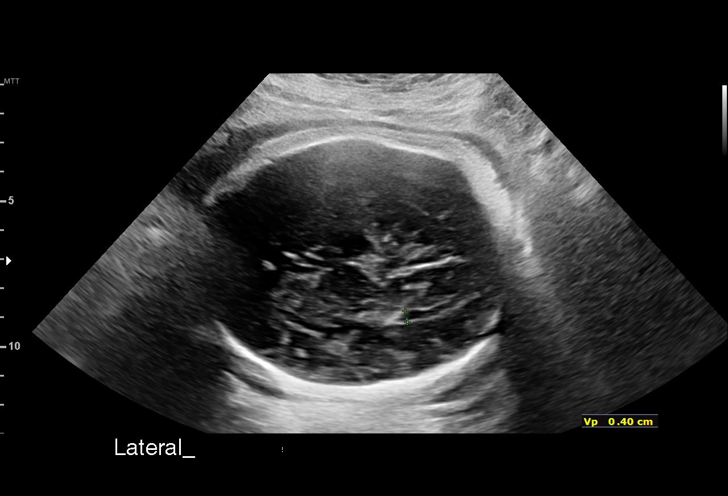
[im 26/42]
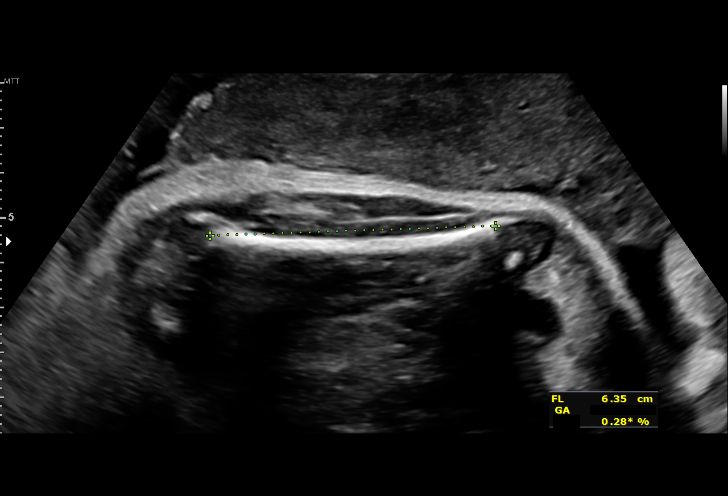
[im 29/42]
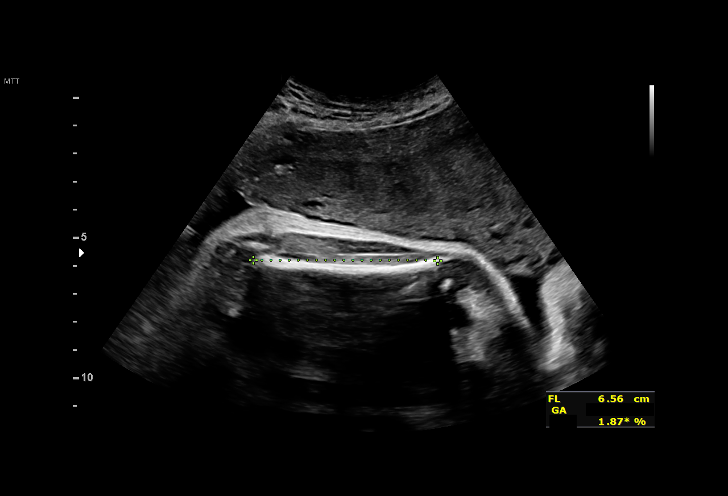
[im 32/42]
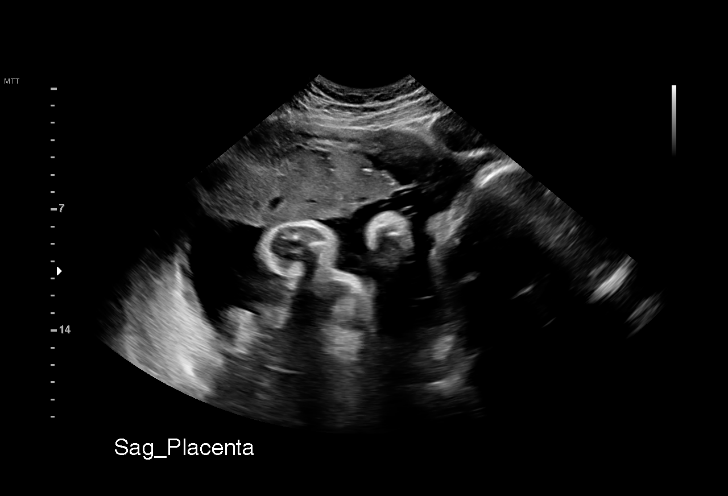
[im 35/42]
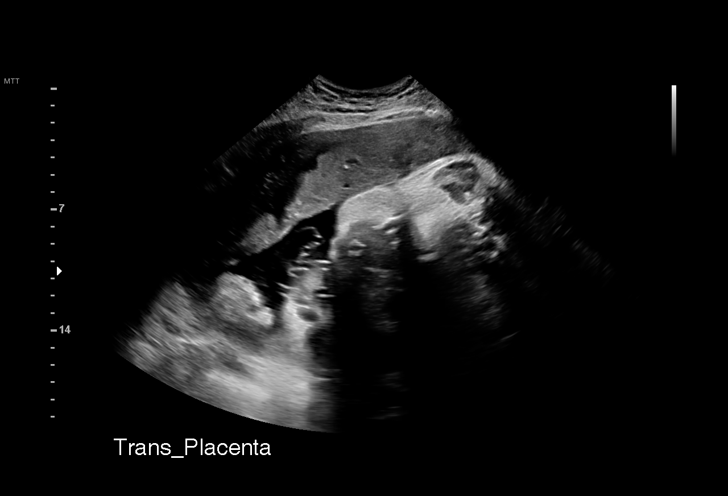
[im 38/42]
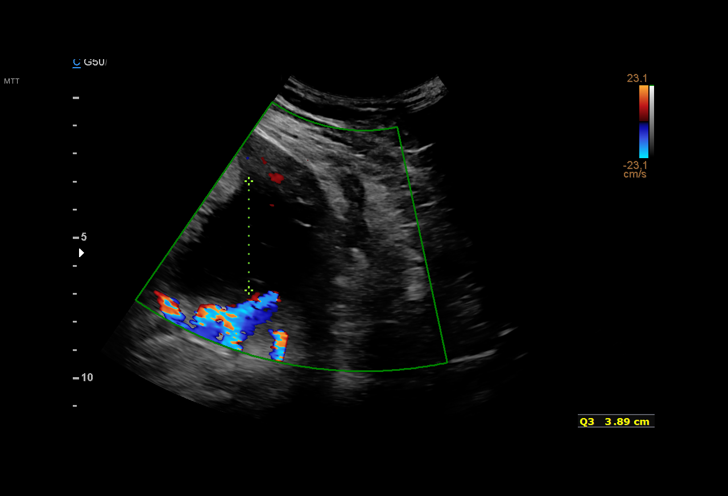
[im 42/42]
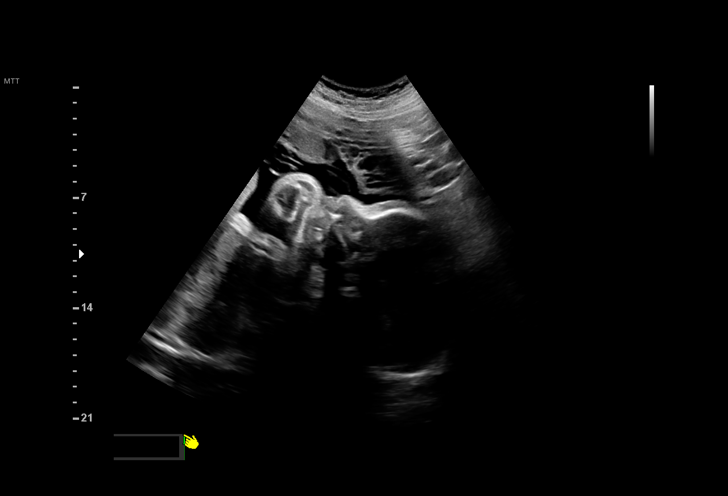

[14 of 28 positions shown; findings below may reference images not displayed]

ASIA GRZEGORZ

Indications

 Gestational diabetes in pregnancy, diet
 controlled
 Poor obstetric history: Previous
 preeclampsia / eclampsia/gestational HTN
 36 weeks gestation of pregnancy
 LR NIPS/ Negative AFP
 Anemia during pregnancy in third trimester
Fetal Evaluation

 Num Of Fetuses:         1
 Fetal Heart Rate(bpm):  129
 Cardiac Activity:       Observed
 Presentation:           Cephalic
 Placenta:               Anterior
 P. Cord Insertion:      Previously Visualized

 Amniotic Fluid
 AFI FV:      Within normal limits

 AFI Sum(cm)     %Tile       Largest Pocket(cm)
 13.4            49

 RUQ(cm)       RLQ(cm)       LUQ(cm)        LLQ(cm)
 6.4           3.1           0
Biometry
 BPD:      85.3  mm     G. Age:  34w 3d          8  %    CI:        72.09   %    70 - 86
                                                         FL/HC:      20.5   %    20.8 -
 HC:      319.7  mm     G. Age:  36w 0d         11  %    HC/AC:      0.99        0.92 -
 AC:      323.5  mm     G. Age:  36w 2d         50  %    FL/BPD:     76.8   %    71 - 87
 FL:       65.5  mm     G. Age:  33w 5d        1.7  %    FL/AC:      20.2   %    20 - 24

 LV:          4  mm

 Est. FW:    5996  gm    5 lb 14 oz      21  %
OB History

 Gravidity:    3         Term:   1         SAB:   1
Gestational Age

 LMP:           35w 5d        Date:  01/04/21                 EDD:   10/11/21
 U/S Today:     35w 1d                                        EDD:   10/15/21
 Best:          36w 5d     Det. By:  Early Ultrasound         EDD:   10/04/21
                                     (03/14/21)
Anatomy

 Cranium:               Appears normal         LVOT:                   Previously seen
 Cavum:                 Appears normal         Aortic Arch:            Previously seen
 Ventricles:            Appears normal         Ductal Arch:            Previously seen
 Choroid Plexus:        Previously seen        Diaphragm:              Appears normal
 Cerebellum:            Previously seen        Stomach:                Appears normal, left
                                                                       sided
 Posterior Fossa:       Previously seen        Abdomen:                Previously seen
 Nuchal Fold:           Previously seen        Abdominal Wall:         Previously seen
 Face:                  Orbits and profile     Cord Vessels:           Previously seen
                        previously seen
 Lips:                  Previously seen        Kidneys:                Appear normal
 Palate:                Previously seen        Bladder:                Appears normal
 Thoracic:              Appears normal         Spine:                  Previously seen
 Heart:                 Appears normal         Upper Extremities:      Previously seen
                        (4CH, axis, and
                        situs)
 RVOT:                  Previously seen        Lower Extremities:      Previously seen

 Other:  Male gender previously seen. Heels/feet, open hands/5th digits, nasal
         bone, lenses, VC, 3VV and 3VTV visualized previously. Technically
         difficult due to maternal habitus and fetal position.
Cervix Uterus Adnexa

 Cervix
 Normal appearance by transabdominal scan.

 Uterus
 No abnormality visualized.

 Right Ovary
 Within normal limits.
 Left Ovary
 Within normal limits.

 Cul De Sac
 No free fluid seen.

 Adnexa
 No adnexal mass visualized.
Comments

 This patient was seen for a follow up growth scan due to diet-
 controlled gestational diabetes.  She denies any problems
 since her last exam.
 She was informed that the fetal growth and amniotic fluid
 level appears appropriate for her gestational age.
 Due to gestational diabetes, delivery is recommended at
 around 39 weeks.
 No further exams were scheduled in our office.
